# Patient Record
Sex: Female | Born: 1975 | Race: White | Hispanic: No | Marital: Married | Smoking: Never smoker
Health system: Southern US, Community
[De-identification: ages and names within clinical notes are randomized; demographics above are authoritative.]

## PROBLEM LIST (undated history)

## (undated) DIAGNOSIS — Z803 Family history of malignant neoplasm of breast: Secondary | ICD-10-CM

## (undated) HISTORY — DX: Family history of malignant neoplasm of breast: Z80.3

## (undated) HISTORY — PX: LITHOTRIPSY: SUR834

---

## 2012-10-03 ENCOUNTER — Emergency Department (HOSPITAL_COMMUNITY): Payer: No Typology Code available for payment source

## 2012-10-03 ENCOUNTER — Emergency Department (HOSPITAL_COMMUNITY)
Admission: EM | Admit: 2012-10-03 | Discharge: 2012-10-03 | Disposition: A | Discharge status: Home / Self Care | Payer: No Typology Code available for payment source | Attending: Emergency Medicine | Admitting: Emergency Medicine

## 2012-10-03 ENCOUNTER — Encounter (HOSPITAL_COMMUNITY): Payer: Self-pay

## 2012-10-03 DIAGNOSIS — R197 Diarrhea, unspecified: Secondary | ICD-10-CM | POA: Insufficient documentation

## 2012-10-03 DIAGNOSIS — F419 Anxiety disorder, unspecified: Secondary | ICD-10-CM

## 2012-10-03 DIAGNOSIS — R42 Dizziness and giddiness: Secondary | ICD-10-CM | POA: Insufficient documentation

## 2012-10-03 DIAGNOSIS — Z79899 Other long term (current) drug therapy: Secondary | ICD-10-CM | POA: Insufficient documentation

## 2012-10-03 DIAGNOSIS — F411 Generalized anxiety disorder: Secondary | ICD-10-CM | POA: Insufficient documentation

## 2012-10-03 LAB — URINALYSIS, ROUTINE W REFLEX MICROSCOPIC
Nitrite: NEGATIVE
Specific Gravity, Urine: 1.004 — ABNORMAL LOW (ref 1.005–1.030)
Urobilinogen, UA: 0.2 mg/dL (ref 0.0–1.0)
pH: 6 (ref 5.0–8.0)

## 2012-10-03 LAB — CBC WITH DIFFERENTIAL/PLATELET
Basophils Absolute: 0 10*3/uL (ref 0.0–0.1)
Basophils Relative: 0 % (ref 0–1)
Eosinophils Absolute: 0 10*3/uL (ref 0.0–0.7)
Eosinophils Relative: 0 % (ref 0–5)
HCT: 42.5 % (ref 36.0–46.0)
MCHC: 36.5 g/dL — ABNORMAL HIGH (ref 30.0–36.0)
MCV: 87.1 fL (ref 78.0–100.0)
Monocytes Absolute: 0.5 10*3/uL (ref 0.1–1.0)
Platelets: 170 10*3/uL (ref 150–400)
RDW: 12.3 % (ref 11.5–15.5)

## 2012-10-03 LAB — BASIC METABOLIC PANEL
CO2: 27 mEq/L (ref 19–32)
Calcium: 10.2 mg/dL (ref 8.4–10.5)
Creatinine, Ser: 0.64 mg/dL (ref 0.50–1.10)
GFR calc Af Amer: >90 mL/min (ref >90)
GFR calc non Af Amer: >90 mL/min (ref >90)

## 2012-10-03 LAB — TSH: TSH: 0.35 u[IU]/mL (ref 0.350–4.500)

## 2012-10-03 LAB — URINE MICROSCOPIC-ADD ON

## 2012-10-03 LAB — POCT PREGNANCY, URINE: Preg Test, Ur: NEGATIVE

## 2012-10-03 NOTE — ED Notes (Signed)
Pt denies stress/anxiety.  States she is under normal stress (work, children, husband) but denies anxiety or stress in the preceding areas.  States PCP felt like it was stress/anxiety and prescribed Paxil but states it has not helped/still has episodes.

## 2012-10-03 NOTE — ED Provider Notes (Signed)
History     CSN: [id number]  Arrival date & time 10/03/12  1150   First MD Initiated Contact with Patient 10/03/12 1610      Chief Complaint  Patient presents with  . Dizziness  . Diarrhea    HPI  36 year old female presents with year-long history of episodes of cold sensation that progressively moves down her body, shortness of breath, chest discomfort, and dizziness. These episodes are brought on at random sporadic times. She states she has a tendency to worry about things. And was recently diagnosed with anxiety. She's was started on Paxil, and Xanax. She was also seen by her primary care provider who told her that she had an abnormality in her thyroid. She has some pending thyroid labs that she plans to followup with her PCP. Today she had another episode similar to the one she's had no past. She states she took her Paxil and her Xanax which seemed to help a bit but she still had the symptoms. She called her primary care provider who instructed her to come to the emergency department. Currently she denies any chest pain, shortness of breath, numbness or tingling, headache, abdominal pain, nausea and or vomiting. She is currently symptom and pain-free. She denies illicit drug use.  No past medical history on file.  No past surgical history on file.  No family history on file.  History  Substance Use Topics  . Smoking status: Never Smoker   . Smokeless tobacco: Not on file  . Alcohol Use: Yes     social    OB History    Grav Para Term Preterm Abortions TAB SAB Ect Mult Living                  Review of Systems  Constitutional: Negative for fever, chills, activity change and appetite change.  HENT: Negative for ear pain, congestion, rhinorrhea and neck pain.   Eyes: Negative for pain.  Respiratory: Negative for cough and shortness of breath.   Cardiovascular: Negative for chest pain and palpitations.  Gastrointestinal: Negative for nausea, vomiting and abdominal pain.    Genitourinary: Negative for dysuria, difficulty urinating and pelvic pain.  Musculoskeletal: Negative for back pain.  Skin: Negative for rash and wound.  Neurological: Negative for weakness and headaches.  Psychiatric/Behavioral: Negative for behavioral problems, confusion and agitation. The patient is nervous/anxious.     Allergies  Iodine and Shellfish allergy  Home Medications   Current Outpatient Rx  Name Route Sig Dispense Refill  . ALPRAZOLAM 0.25 MG PO TABS Oral Take 0.25 mg by mouth daily as needed. For anxiety    . FEXOFENADINE HCL 180 MG PO TABS Oral Take 180 mg by mouth daily.    . IBUPROFEN 200 MG PO TABS Oral Take 600 mg by mouth every 6 (six) hours as needed. For pain    . NAPROXEN SODIUM 220 MG PO TABS Oral Take 440 mg by mouth daily as needed. For pain    . PAROXETINE HCL 10 MG PO TABS Oral Take 10 mg by mouth at bedtime.    Marland Kitchen PRESCRIPTION MEDICATION Injection Inject 1 each as directed once a week. Pt gets 2 vials of allergy shots (medication that she is allergic to, to help build up her immune system).   It is compounded by [facility] [phone]).  Called the facility to confirm      BP 138/93  Pulse 81  Temp 98.4 F (36.9 C) (Oral)  Resp 18  SpO2 100%  LMP 09/12/2012  Physical Exam  Constitutional: She is oriented to person, place, and time. She appears well-developed and well-nourished. No distress.  HENT:  Head: Normocephalic and atraumatic.  Nose: Nose normal.  Mouth/Throat: Oropharynx is clear and moist.  Eyes: EOM are normal. Pupils are equal, round, and reactive to light.  Neck: Normal range of motion. Neck supple. No tracheal deviation present.  Cardiovascular: Normal rate, regular rhythm, normal heart sounds and intact distal pulses.   Pulmonary/Chest: Effort normal and breath sounds normal. She has no rales.  Abdominal: Soft. Bowel sounds are normal. She exhibits no distension. There is no tenderness. There is no rebound and no  guarding.  Musculoskeletal: Normal range of motion. She exhibits no tenderness.  Neurological: She is alert and oriented to person, place, and time.  Skin: Skin is warm and dry. No rash noted.  Psychiatric: She has a normal mood and affect. Her behavior is normal.    ED Course  Procedures (including critical care time)  Results for orders placed during the hospital encounter of 10/03/12  URINALYSIS, ROUTINE W REFLEX MICROSCOPIC      Component Value Range   Color, Urine YELLOW  YELLOW   APPearance CLEAR  CLEAR   Specific Gravity, Urine 1.004 (*) 1.005 - 1.030   pH 6.0  5.0 - 8.0   Glucose, UA NEGATIVE  NEGATIVE mg/dL   Hgb urine dipstick TRACE (*) NEGATIVE   Bilirubin Urine NEGATIVE  NEGATIVE   Ketones, ur NEGATIVE  NEGATIVE mg/dL   Protein, ur NEGATIVE  NEGATIVE mg/dL   Urobilinogen, UA 0.2  0.0 - 1.0 mg/dL   Nitrite NEGATIVE  NEGATIVE   Leukocytes, UA NEGATIVE  NEGATIVE  POCT PREGNANCY, URINE      Component Value Range   Preg Test, Ur NEGATIVE  NEGATIVE  URINE MICROSCOPIC-ADD ON      Component Value Range   Squamous Epithelial / LPF RARE  RARE   WBC, UA 0-2  <3 WBC/hpf   RBC / HPF 0-2  <3 RBC/hpf   Bacteria, UA RARE  RARE  CBC WITH DIFFERENTIAL      Component Value Range   WBC 9.5  4.0 - 10.5 K/uL   RBC 4.88  3.87 - 5.11 MIL/uL   Hemoglobin 15.5 (*) 12.0 - 15.0 g/dL   HCT 04.5  40.9 - 81.1 %   MCV 87.1  78.0 - 100.0 fL   MCH 31.8  26.0 - 34.0 pg   MCHC 36.5 (*) 30.0 - 36.0 g/dL   RDW 91.4  78.2 - 95.6 %   Platelets 170  150 - 400 K/uL   Neutrophils Relative 67  43 - 77 %   Neutro Abs 6.4  1.7 - 7.7 K/uL   Lymphocytes Relative 27  12 - 46 %   Lymphs Abs 2.5  0.7 - 4.0 K/uL   Monocytes Relative 6  3 - 12 %   Monocytes Absolute 0.5  0.1 - 1.0 K/uL   Eosinophils Relative 0  0 - 5 %   Eosinophils Absolute 0.0  0.0 - 0.7 K/uL   Basophils Relative 0  0 - 1 %   Basophils Absolute 0.0  0.0 - 0.1 K/uL  BASIC METABOLIC PANEL      Component Value Range   Sodium 137  135  - 145 mEq/L   Potassium 4.0  3.5 - 5.1 mEq/L   Chloride 101  96 - 112 mEq/L   CO2 27  19 - 32 mEq/L   Glucose, Bld 121 (*)  70 - 99 mg/dL   BUN 7  6 - 23 mg/dL   Creatinine, Ser 1.61  0.50 - 1.10 mg/dL   Calcium 09.6  8.4 - 04.5 mg/dL   GFR calc non Af Amer >90  >90 mL/min   GFR calc Af Amer >90  >90 mL/min  TSH      Component Value Range   TSH 0.350  0.350 - 4.500 uIU/mL    DG Chest 2 View (Final result)   Result time:10/03/12 1702    Final result by Rad Results In Interface (10/03/12 17:02:32)    Narrative:   *RADIOLOGY REPORT*  Clinical Data: Nausea. Spells.  CHEST - 2 VIEW  Comparison: None.  Findings: Lungs clear. Heart size is normal. No pneumothorax pleural fluid. No focal bony abnormality.  IMPRESSION: Negative chest.   Original Report Authenticated By: Bernadene Bell. D'ALESSIO, M.D.          1. Anxiety       MDM    36 year old female in no acute distress, afebrile, vital signs stable, non toxic appearing who presents with chronic history of brief episodes shortness of breath, tachycardia, cold extremities, and chest pain. During ED course the patient had no more recurrent episodes. She states that she is followed by her primary care doctor and has a thyroid panel that is currently pending. Lab work reassuring. Chest x-ray normal. TSH normal. Thorough discussion with patient on findings, return precautions, and plan. Will follow up with her primary care provider to obtain results of thyroid panel. At time of discharge patient hemodynamically stable, tolerating by mouth, ambulating well.        Nadara Mustard, MD 10/04/12 [phone]

## 2012-10-03 NOTE — ED Notes (Signed)
Pt recently started treatment for anxiety, sts she has waves of energy come over, dizzy, sob, and becomes pale. Labs showed abn thyroid studies and she just does not feel right.

## 2012-10-04 NOTE — ED Provider Notes (Deleted)
CRITICAL CARE Performed by: Nelia Shi   Total critical care time:45 min  Critical care time was exclusive of separately billable procedures and treating other patients.  Critical care was necessary to treat or prevent imminent or life-threatening deterioration.  Critical care was time spent personally by me on the following activities: development of treatment plan with patient and/or surrogate as well as nursing, discussions with consultants, evaluation of patient's response to treatment, examination of patient, obtaining history from patient or surrogate, ordering and performing treatments and interventions, ordering and review of laboratory studies, ordering and review of radiographic studies, pulse oximetry and re-evaluation of patient's condition.  I saw and evaluated the patient, reviewed the resident's note and I agree with the findings and plan.   .Face to face Exam:  General:  Awake HEENT:  Atraumatic Resp:  Normal effort Abd:  Nondistended Neuro:No focal weakness Lymph: No adenopathy    Nelia Shi, MD 10/04/12 1202

## 2012-10-04 NOTE — ED Provider Notes (Signed)
I saw and evaluated the patient, reviewed the resident's note and I agree with the findings and plan.     Nelia Shi, MD 10/04/12 [phone]

## 2014-04-14 LAB — HM PAP SMEAR: HM PAP: NEGATIVE

## 2014-04-24 DIAGNOSIS — G479 Sleep disorder, unspecified: Secondary | ICD-10-CM | POA: Insufficient documentation

## 2016-03-14 ENCOUNTER — Encounter: Payer: Self-pay | Admitting: Emergency Medicine

## 2016-03-14 ENCOUNTER — Emergency Department
Admission: EM | Admit: 2016-03-14 | Discharge: 2016-03-14 | Disposition: A | Discharge status: Home / Self Care | Payer: Worker's Compensation | Attending: Emergency Medicine | Admitting: Emergency Medicine

## 2016-03-14 DIAGNOSIS — Y9301 Activity, walking, marching and hiking: Secondary | ICD-10-CM | POA: Diagnosis not present

## 2016-03-14 DIAGNOSIS — S59911A Unspecified injury of right forearm, initial encounter: Secondary | ICD-10-CM | POA: Insufficient documentation

## 2016-03-14 DIAGNOSIS — S4990XA Unspecified injury of shoulder and upper arm, unspecified arm, initial encounter: Secondary | ICD-10-CM | POA: Diagnosis not present

## 2016-03-14 DIAGNOSIS — Y998 Other external cause status: Secondary | ICD-10-CM | POA: Diagnosis not present

## 2016-03-14 DIAGNOSIS — M5412 Radiculopathy, cervical region: Secondary | ICD-10-CM | POA: Diagnosis not present

## 2016-03-14 DIAGNOSIS — R110 Nausea: Secondary | ICD-10-CM | POA: Insufficient documentation

## 2016-03-14 DIAGNOSIS — S0990XA Unspecified injury of head, initial encounter: Secondary | ICD-10-CM | POA: Diagnosis not present

## 2016-03-14 DIAGNOSIS — Z79899 Other long term (current) drug therapy: Secondary | ICD-10-CM | POA: Insufficient documentation

## 2016-03-14 DIAGNOSIS — W208XXA Other cause of strike by thrown, projected or falling object, initial encounter: Secondary | ICD-10-CM | POA: Diagnosis not present

## 2016-03-14 DIAGNOSIS — S59912A Unspecified injury of left forearm, initial encounter: Secondary | ICD-10-CM | POA: Diagnosis not present

## 2016-03-14 DIAGNOSIS — Y9289 Other specified places as the place of occurrence of the external cause: Secondary | ICD-10-CM | POA: Diagnosis not present

## 2016-03-14 DIAGNOSIS — S161XXA Strain of muscle, fascia and tendon at neck level, initial encounter: Secondary | ICD-10-CM

## 2016-03-14 MED ORDER — CYCLOBENZAPRINE HCL 10 MG PO TABS: 10.0000 mg | ORAL_TABLET | Freq: Three times a day (TID) | ORAL | Status: DC | PRN

## 2016-03-14 MED ORDER — HYDROCODONE-ACETAMINOPHEN 5-325 MG PO TABS: 1.0000 | ORAL_TABLET | ORAL | Status: DC | PRN

## 2016-03-14 MED ORDER — ONDANSETRON HCL 4 MG PO TABS: 4.0000 mg | ORAL_TABLET | Freq: Three times a day (TID) | ORAL | Status: DC | PRN

## 2016-03-14 NOTE — ED Notes (Addendum)
Pt states was at work and a 20 pound box fell on top of her head. Pt reports now has neck pain, headache that radiates into her shoulders and arm. Pt states her fingers have been numb.Pt states became nauseated while driving here but denies vomiting. Pt will be filing worker's compensation claim.

## 2016-03-14 NOTE — Discharge Instructions (Signed)
Concussion, Adult  A concussion, or closed-head injury, is a brain injury caused by a direct blow to the head or by a quick and sudden movement (jolt) of the head or neck. Concussions are usually not life-threatening. Even so, the effects of a concussion can be serious. If you have had a concussion before, you are more likely to experience concussion-like symptoms after a direct blow to the head.   CAUSES  · Direct blow to the head, such as from running into another player during a soccer game, being hit in a fight, or hitting your head on a hard surface.  · A jolt of the head or neck that causes the brain to move back and forth inside the skull, such as in a car crash.  SIGNS AND SYMPTOMS  The signs of a concussion can be hard to notice. Early on, they may be missed by you, family members, and health care providers. You may look fine but act or feel differently.  Symptoms are usually temporary, but they may last for days, weeks, or even longer. Some symptoms may appear right away while others may not show up for hours or days. Every head injury is different. Symptoms include:  · Mild to moderate headaches that will not go away.  · A feeling of pressure inside your head.  · Having more trouble than usual:    Learning or remembering things you have heard.    Answering questions.    Paying attention or concentrating.    Organizing daily tasks.    Making decisions and solving problems.  · Slowness in thinking, acting or reacting, speaking, or reading.  · Getting lost or being easily confused.  · Feeling tired all the time or lacking energy (fatigued).  · Feeling drowsy.  · Sleep disturbances.    Sleeping more than usual.    Sleeping less than usual.    Trouble falling asleep.    Trouble sleeping (insomnia).  · Loss of balance or feeling lightheaded or dizzy.  · Nausea or vomiting.  · Numbness or tingling.  · Increased sensitivity to:    Sounds.    Lights.    Distractions.  · Vision problems or eyes that tire  easily.  · Diminished sense of taste or smell.  · Ringing in the ears.  · Mood changes such as feeling sad or anxious.  · Becoming easily irritated or angry for little or no reason.  · Lack of motivation.  · Seeing or hearing things other people do not see or hear (hallucinations).  DIAGNOSIS  Your health care provider can usually diagnose a concussion based on a description of your injury and symptoms. He or she will ask whether you passed out (lost consciousness) and whether you are having trouble remembering events that happened right before and during your injury.  Your evaluation might include:  · A brain scan to look for signs of injury to the brain. Even if the test shows no injury, you may still have a concussion.  · Blood tests to be sure other problems are not present.  TREATMENT  · Concussions are usually treated in an emergency department, in urgent care, or at a clinic. You may need to stay in the hospital overnight for further treatment.  · Tell your health care provider if you are taking any medicines, including prescription medicines, over-the-counter medicines, and natural remedies. Some medicines, such as blood thinners (anticoagulants) and aspirin, may increase the chance of complications. Also tell your health care   provider whether you have had alcohol or are taking illegal drugs. This information may affect treatment.  · Your health care provider will send you home with important instructions to follow.  · How fast you will recover from a concussion depends on many factors. These factors include how severe your concussion is, what part of your brain was injured, your age, and how healthy you were before the concussion.  · Most people with mild injuries recover fully. Recovery can take time. In general, recovery is slower in older persons. Also, persons who have had a concussion in the past or have other medical problems may find that it takes longer to recover from their current injury.  HOME  CARE INSTRUCTIONS  General Instructions  · Carefully follow the directions your health care provider gave you.  · Only take over-the-counter or prescription medicines for pain, discomfort, or fever as directed by your health care provider.  · Take only those medicines that your health care provider has approved.  · Do not drink alcohol until your health care provider says you are well enough to do so. Alcohol and certain other drugs may slow your recovery and can put you at risk of further injury.  · If it is harder than usual to remember things, write them down.  · If you are easily distracted, try to do one thing at a time. For example, do not try to watch TV while fixing dinner.  · Talk with family members or close friends when making important decisions.  · Keep all follow-up appointments. Repeated evaluation of your symptoms is recommended for your recovery.  · Watch your symptoms and tell others to do the same. Complications sometimes occur after a concussion. Older adults with a brain injury may have a higher risk of serious complications, such as a blood clot on the brain.  · Tell your teachers, school nurse, school counselor, coach, athletic trainer, or work manager about your injury, symptoms, and restrictions. Tell them about what you can or cannot do. They should watch for:    Increased problems with attention or concentration.    Increased difficulty remembering or learning new information.    Increased time needed to complete tasks or assignments.    Increased irritability or decreased ability to cope with stress.    Increased symptoms.  · Rest. Rest helps the brain to heal. Make sure you:    Get plenty of sleep at night. Avoid staying up late at night.    Keep the same bedtime hours on weekends and weekdays.    Rest during the day. Take daytime naps or rest breaks when you feel tired.  · Limit activities that require a lot of thought or concentration. These include:    Doing homework or job-related  work.    Watching TV.    Working on the computer.  · Avoid any situation where there is potential for another head injury (football, hockey, soccer, basketball, martial arts, downhill snow sports and horseback riding). Your condition will get worse every time you experience a concussion. You should avoid these activities until you are evaluated by the appropriate follow-up health care providers.  Returning To Your Regular Activities  You will need to return to your normal activities slowly, not all at once. You must give your body and brain enough time for recovery.  · Do not return to sports or other athletic activities until your health care provider tells you it is safe to do so.  · Ask   your health care provider when you can drive, ride a bicycle, or operate heavy machinery. Your ability to react may be slower after a brain injury. Never do these activities if you are dizzy.  · Ask your health care provider about when you can return to work or school.  Preventing Another Concussion  It is very important to avoid another brain injury, especially before you have recovered. In rare cases, another injury can lead to permanent brain damage, brain swelling, or death. The risk of this is greatest during the first 7-10 days after a head injury. Avoid injuries by:  · Wearing a seat belt when riding in a car.  · Drinking alcohol only in moderation.  · Wearing a helmet when biking, skiing, skateboarding, skating, or doing similar activities.  · Avoiding activities that could lead to a second concussion, such as contact or recreational sports, until your health care provider says it is okay.  · Taking safety measures in your home.    Remove clutter and tripping hazards from floors and stairways.    Use grab bars in bathrooms and handrails by stairs.    Place non-slip mats on floors and in bathtubs.    Improve lighting in dim areas.  SEEK MEDICAL CARE IF:  · You have increased problems paying attention or  concentrating.  · You have increased difficulty remembering or learning new information.  · You need more time to complete tasks or assignments than before.  · You have increased irritability or decreased ability to cope with stress.  · You have more symptoms than before.  Seek medical care if you have any of the following symptoms for more than 2 weeks after your injury:  · Lasting (chronic) headaches.  · Dizziness or balance problems.  · Nausea.  · Vision problems.  · Increased sensitivity to noise or light.  · Depression or mood swings.  · Anxiety or irritability.  · Memory problems.  · Difficulty concentrating or paying attention.  · Sleep problems.  · Feeling tired all the time.  SEEK IMMEDIATE MEDICAL CARE IF:  · You have severe or worsening headaches. These may be a sign of a blood clot in the brain.  · You have weakness (even if only in one hand, leg, or part of the face).  · You have numbness.  · You have decreased coordination.  · You vomit repeatedly.  · You have increased sleepiness.  · One pupil is larger than the other.  · You have convulsions.  · You have slurred speech.  · You have increased confusion. This may be a sign of a blood clot in the brain.  · You have increased restlessness, agitation, or irritability.  · You are unable to recognize people or places.  · You have neck pain.  · It is difficult to wake you up.  · You have unusual behavior changes.  · You lose consciousness.  MAKE SURE YOU:  · Understand these instructions.  · Will watch your condition.  · Will get help right away if you are not doing well or get worse.     This information is not intended to replace advice given to you by your health care provider. Make sure you discuss any questions you have with your health care provider.     Document Released: 02/25/2004 Document Revised: 12/26/2014 Document Reviewed: 06/27/2013  [employer] Interactive Patient Education ©2016 [employer]

## 2016-03-14 NOTE — ED Provider Notes (Signed)
[facility] Emergency Department Provider Note ____________________________________________  Time seen: Approximately 7:38 PM  I have reviewed the triage vital signs and the nursing notes.   HISTORY  Chief Complaint Head Injury    HPI Dawn Wall is a 40 y.o. female who presents to the emergency department for evaluation after head injury. She states that while at work, she was walking by a shelf that had a box weighing approximately 20 pounds fall onto the top of her head. She denies loss of consciousness, but states that she has felt a little dizzy and nauseated but is not confused and the dizziness has resolved. She states that she has had some intermittent blurred vision as well.She also complains of some bilateral arm sensations that reassemble how she would feel after lifting weights, but denies recent exercise. She reports she has a history of degenerative disc disease without surgery.  History reviewed. No pertinent past medical history.  There are no active problems to display for this patient.   Past Surgical History  Procedure Laterality Date  . Lithotripsy      Current Outpatient Rx  Name  Route  Sig  Dispense  Refill  . ALPRAZolam (XANAX) 0.25 MG tablet   Oral   Take 0.25 mg by mouth daily as needed. For anxiety         . cyclobenzaprine (FLEXERIL) 10 MG tablet   Oral   Take 1 tablet (10 mg total) by mouth 3 (three) times daily as needed for muscle spasms.   30 tablet   0   . fexofenadine (ALLEGRA) 180 MG tablet   Oral   Take 180 mg by mouth daily.         Marland Kitchen HYDROcodone-acetaminophen (NORCO/VICODIN) 5-325 MG tablet   Oral   Take 1 tablet by mouth every 4 (four) hours as needed for moderate pain.   20 tablet   0   . ibuprofen (ADVIL,MOTRIN) 200 MG tablet   Oral   Take 600 mg by mouth every 6 (six) hours as needed. For pain         . naproxen sodium (ANAPROX) 220 MG tablet   Oral   Take 440 mg by mouth daily as needed.  For pain         . ondansetron (ZOFRAN) 4 MG tablet   Oral   Take 1 tablet (4 mg total) by mouth every 8 (eight) hours as needed for nausea or vomiting.   20 tablet   0   . PARoxetine (PAXIL) 10 MG tablet   Oral   Take 10 mg by mouth at bedtime.         Marland Kitchen PRESCRIPTION MEDICATION   Injection   Inject 1 each as directed once a week. Pt gets 2 vials of allergy shots (medication that she is allergic to, to help build up her immune system).   It is compounded by [facility] [phone]).  Called the facility to confirm           Allergies Iodine and Shellfish allergy  No family history on file.  Social History Social History  Substance Use Topics  . Smoking status: Never Smoker   . Smokeless tobacco: None  . Alcohol Use: Yes     Comment: social    Review of Systems Constitutional: No recent illness. Eyes: Intermittent blurred vision ENT: No sore throat. Cardiovascular: Denies chest pain or palpitations. Respiratory: Denies shortness of breath. Gastrointestinal: Positive for nausea Musculoskeletal: Pain in back of head,  neck, shoulders, and bilateral forearms. Skin: Negative for visual laceration. Neurological: Negative for headaches, focal weakness or numbness. Positive for abnormal sensation of the bilateral forearms. ____________________________________________   PHYSICAL EXAM:  VITAL SIGNS: ED Triage Vitals  Enc Vitals Group     BP 03/14/16 1804 144/91 mmHg     Pulse Rate 03/14/16 1804 97     Resp 03/14/16 1804 18     Temp 03/14/16 1804 98.2 F (36.8 C)     Temp Source 03/14/16 1804 Oral     SpO2 03/14/16 1804 97 %     Weight 03/14/16 1804 135 lb (61.236 kg)     Height 03/14/16 1804 5\' 2"  (1.575 m)     Head Cir --      Peak Flow --      Pain Score 03/14/16 1804 7     Pain Loc --      Pain Edu? --      Excl. in GC? --     Constitutional: Alert and oriented. Well appearing and in no acute distress. Eyes: Conjunctivae are normal.  EOMI. Head: Atraumatic. Nose: No congestion/rhinnorhea. Neck: No stridor.  Respiratory: Normal respiratory effort.   Musculoskeletal: Active, full range of motion of the neck. Nexus criteria negative. Cervical paraspinal tenderness present on exam. Trapezius tenderness on exam. Neurologic:  Normal speech and language. No gross focal neurologic deficits are appreciated. Speech is normal. No gait instability. Cranial nerves normal as tested. Grip strength is equal. 2 point discrimination intact of bilateral forearms and hands. Skin:  Skin is warm, dry and intact. Atraumatic. Psychiatric: Mood and affect are normal. Speech and behavior are normal.  ____________________________________________   LABS (all labs ordered are listed, but only abnormal results are displayed)  Labs Reviewed - No data to display ____________________________________________  RADIOLOGY  CT not indicated ____________________________________________   PROCEDURES  Procedure(s) performed: None   ____________________________________________   INITIAL IMPRESSION / ASSESSMENT AND PLAN / ED COURSE  Pertinent labs & imaging results that were available during my care of the patient were reviewed by me and considered in my medical decision making (see chart for details).  Patient was advised to return to the emergency department immediately for any changes of concern. She was provided head injury instructions as well as cervical spine strain and cervical radiculopathy instructions. She intends to follow up with her primary care or walking clinic [facility] for symptoms that are not improving over the next few days. He was also advised to follow-up with her neurologist for paresthesias if they do not resolve with medications. ____________________________________________   FINAL CLINICAL IMPRESSION(S) / ED DIAGNOSES  Final diagnoses:  Minor head injury, initial encounter  Cervical strain, acute, initial  encounter  Cervical radiculopathy       Chinita PesterCari B Karleen Seebeck, FNP 03/14/16 2016  Loleta Roseory Forbach, MD 03/14/16 2351

## 2016-03-14 NOTE — ED Notes (Signed)
See triage note  States she had a box fall on her head at work. Denies any LOC but is having some pain to neck and shoulders

## 2016-03-17 ENCOUNTER — Encounter: Payer: Self-pay | Admitting: Emergency Medicine

## 2016-03-17 ENCOUNTER — Emergency Department
Admission: EM | Admit: 2016-03-17 | Discharge: 2016-03-17 | Disposition: A | Discharge status: Home / Self Care | Payer: Worker's Compensation | Attending: Emergency Medicine | Admitting: Emergency Medicine

## 2016-03-17 ENCOUNTER — Emergency Department: Payer: Worker's Compensation

## 2016-03-17 DIAGNOSIS — Z79899 Other long term (current) drug therapy: Secondary | ICD-10-CM | POA: Insufficient documentation

## 2016-03-17 DIAGNOSIS — M542 Cervicalgia: Secondary | ICD-10-CM | POA: Diagnosis present

## 2016-03-17 DIAGNOSIS — S161XXD Strain of muscle, fascia and tendon at neck level, subsequent encounter: Secondary | ICD-10-CM | POA: Diagnosis not present

## 2016-03-17 DIAGNOSIS — F0781 Postconcussional syndrome: Secondary | ICD-10-CM | POA: Diagnosis not present

## 2016-03-17 DIAGNOSIS — Z791 Long term (current) use of non-steroidal anti-inflammatories (NSAID): Secondary | ICD-10-CM | POA: Diagnosis not present

## 2016-03-17 DIAGNOSIS — W208XXD Other cause of strike by thrown, projected or falling object, subsequent encounter: Secondary | ICD-10-CM | POA: Diagnosis not present

## 2016-03-17 DIAGNOSIS — M5412 Radiculopathy, cervical region: Secondary | ICD-10-CM | POA: Insufficient documentation

## 2016-03-17 MED ORDER — METOCLOPRAMIDE HCL 10 MG PO TABS
10.0000 mg | ORAL_TABLET | Freq: Once | ORAL | Status: AC
  Administered 2016-03-17: 10 mg via ORAL
  Filled 2016-03-17: qty 1

## 2016-03-17 MED ORDER — KETOROLAC TROMETHAMINE 60 MG/2ML IM SOLN
30.0000 mg | Freq: Once | INTRAMUSCULAR | Status: AC
  Administered 2016-03-17: 30 mg via INTRAMUSCULAR
  Filled 2016-03-17: qty 2

## 2016-03-17 MED ORDER — DIAZEPAM 2 MG PO TABS: 1.0000 mg | ORAL_TABLET | Freq: Three times a day (TID) | ORAL | Status: DC | PRN

## 2016-03-17 MED ORDER — DIAZEPAM 5 MG PO TABS
5.0000 mg | ORAL_TABLET | Freq: Once | ORAL | Status: AC
  Administered 2016-03-17: 5 mg via ORAL
  Filled 2016-03-17: qty 1

## 2016-03-17 MED ORDER — KETOROLAC TROMETHAMINE 10 MG PO TABS: 10.0000 mg | ORAL_TABLET | Freq: Three times a day (TID) | ORAL | Status: DC

## 2016-03-17 MED ORDER — METOCLOPRAMIDE HCL 5 MG PO TABS: 5.0000 mg | ORAL_TABLET | Freq: Three times a day (TID) | ORAL | Status: DC | PRN

## 2016-03-17 MED ORDER — DIPHENHYDRAMINE HCL 25 MG PO CAPS
25.0000 mg | ORAL_CAPSULE | Freq: Once | ORAL | Status: AC
  Administered 2016-03-17: 25 mg via ORAL
  Filled 2016-03-17: qty 1

## 2016-03-17 NOTE — Discharge Instructions (Signed)
Cervical Radiculopathy Cervical radiculopathy means that a nerve in the neck is pinched or bruised. This can cause pain or loss of feeling (numbness) that runs from your neck to your arm and fingers. HOME CARE Managing Pain  Take over-the-counter and prescription medicines only as told by your doctor.  If directed, put ice on the injured or painful area.  Put ice in a plastic bag.  Place a towel between your skin and the bag.  Leave the ice on for 20 minutes, 2-3 times per day.  If ice does not help, you can try using heat. Take a warm shower or warm bath, or use a heat pack as told by your doctor.  You may try a gentle neck and shoulder massage. Activity  Rest as needed. Follow instructions from your doctor about any activities to avoid.  Do exercises as told by your doctor or physical therapist. General Instructions   If you were given a soft collar, wear it as told by your doctor.  Use a flat pillow when you sleep.  Keep all follow-up visits as told by your doctor. This is important. GET HELP IF:  Your condition does not improve with treatment. GET HELP RIGHT AWAY IF:   Your pain gets worse and is not controlled with medicine.  You lose feeling or feel weak in your hand, arm, face, or leg.  You have a fever.  You have a stiff neck.  You cannot control when you poop or pee (have incontinence).  You have trouble with walking, balance, or talking.   This information is not intended to replace advice given to you by your health care provider. Make sure you discuss any questions you have with your health care provider.   Document Released: 11/24/2011 Document Revised: 08/26/2015 Document Reviewed: 01/29/2015 Elsevier Interactive Patient Education 2016 [employer]  Post-Concussion Syndrome Post-concussion syndrome describes the symptoms that can occur after a head injury. These symptoms can last from weeks to months. CAUSES  It is not clear why some head  injuries cause post-concussion syndrome. It can occur whether your head injury was mild or severe and whether you were wearing head protection or not.  SIGNS AND SYMPTOMS  Memory difficulties.  Dizziness.  Headaches.  Double vision or blurry vision.  Sensitivity to light.  Hearing difficulties.  Depression.  Tiredness.  Weakness.  Difficulty with concentration.  Difficulty sleeping or staying asleep.  Vomiting.  Poor balance or instability on your feet.  Slow reaction time.  Difficulty learning and remembering things you have heard. DIAGNOSIS  There is no test to determine whether you have post-concussion syndrome. Your health care provider may order an imaging scan of your brain, such as a CT scan, to check for other problems that may be causing your symptoms (such as a severe injury inside your skull). TREATMENT  Usually, these problems disappear over time without medical care. Your health care provider may prescribe medicine to help ease your symptoms. It is important to follow up with a neurologist to evaluate your recovery and address any lingering symptoms or issues. HOME CARE INSTRUCTIONS   Take medicines only as directed by your health care provider. Do not take aspirin. Aspirin can slow blood clotting.  Sleep with your head slightly elevated to help with headaches.  Avoid any situation where there is potential for another head injury. This includes football, hockey, soccer, basketball, martial arts, downhill snow sports, and horseback riding. Your condition will get worse every time you experience a concussion. You  should avoid these activities until you are evaluated by the appropriate follow-up health care providers.  Keep all follow-up visits as directed by your health care provider. This is important. SEEK MEDICAL CARE IF:  You have increased problems paying attention or concentrating.  You have increased difficulty remembering or learning new  information.  You need more time to complete tasks or assignments than before.  You have increased irritability or decreased ability to cope with stress.  You have more symptoms than before. Seek medical care if you have any of the following symptoms for more than two weeks after your injury:  Lasting (chronic) headaches.  Dizziness or balance problems.  Nausea.  Vision problems.  Increased sensitivity to noise or light.  Depression or mood swings.  Anxiety or irritability.  Memory problems.  Difficulty concentrating or paying attention.  Sleep problems.  Feeling tired all the time. SEEK IMMEDIATE MEDICAL CARE IF:  You have confusion or unusual drowsiness.  Others find it difficult to wake you up.  You have nausea or persistent, forceful vomiting.  You feel like you are moving when you are not (vertigo). Your eyes may move rapidly back and forth.  You have convulsions or faint.  You have severe, persistent headaches that are not relieved by medicine.  You cannot use your arms or legs normally.  One of your pupils is larger than the other.  You have clear or bloody discharge from your nose or ears.  Your problems are getting worse, not better. MAKE SURE YOU:  Understand these instructions.  Will watch your condition.  Will get help right away if you are not doing well or get worse.   This information is not intended to replace advice given to you by your health care provider. Make sure you discuss any questions you have with your health care provider.   Document Released: 05/27/2002 Document Revised: 12/26/2014 Document Reviewed: 03/12/2014 Elsevier Interactive Patient Education [employer]   Your exam and neck x-ray are essentially normal today following your evaluation. You should continue to monitor and treat symptoms as discussed. Follow-up with your company's provider or return to the ED for worsening symptoms as described. Take the  prescription meds with precaution due to drowsiness.

## 2016-03-17 NOTE — ED Provider Notes (Signed)
[facility] Emergency Department Provider Note ____________________________________________  Time seen: 1610  I have reviewed the triage vital signs and the nursing notes.  HISTORY  Chief Complaint  Headache; Torticollis; and Shoulder Pain  HPI Dawn Wall is a 40 y.o. female presents to the ED from [facility], for evaluation of continued headache pain, neck pain, and upper extremity paresthesias. The patient was evaluated here on Monday, following a work-related injury. She describes a 20 pound box of supplies falling from an overhead shelf hitting her on top of the head. She describes an accordion-type mechanismcausing neck pain and posterior headache pain. She also describes some numbness into the upper extremities including the palms and fingers. She was evaluated here and found to be clinically stable with no indication for head CT at that time. She was discharged with prescriptions for Flexeril and Vicodin to dose as needed for headache and neck pain. She denies significant pain relief using those meds. The patient was also held out of work for one day. She returned to work on Wednesday, but noted increased headache pain, dizziness, lack of concentration, fogginess, and fatigue. She reports a remote history of neck injury and report of cervical fusion following a cheerleading accident several years ago, evaluated by a chiropractor. She is primarily complaining of right temporal headache pain, but is concerned about her right-sided neck pain and paresthesias. She reports her headache pain at 9/10 at arrival, but is 7/10 during interview.   History reviewed. No pertinent past medical history.  There are no active problems to display for this patient.   Past Surgical History  Procedure Laterality Date  . Lithotripsy      Current Outpatient Rx  Name  Route  Sig  Dispense  Refill  . ALPRAZolam (XANAX) 0.25 MG tablet   Oral   Take 0.25 mg by mouth daily as needed. For  anxiety         . cyclobenzaprine (FLEXERIL) 10 MG tablet   Oral   Take 1 tablet (10 mg total) by mouth 3 (three) times daily as needed for muscle spasms.   30 tablet   0   . diazepam (VALIUM) 2 MG tablet   Oral   Take 0.5 tablets (1 mg total) by mouth every 8 (eight) hours as needed for muscle spasms.   10 tablet   0   . fexofenadine (ALLEGRA) 180 MG tablet   Oral   Take 180 mg by mouth daily.         Marland Kitchen HYDROcodone-acetaminophen (NORCO/VICODIN) 5-325 MG tablet   Oral   Take 1 tablet by mouth every 4 (four) hours as needed for moderate pain.   20 tablet   0   . ibuprofen (ADVIL,MOTRIN) 200 MG tablet   Oral   Take 600 mg by mouth every 6 (six) hours as needed. For pain         . ketorolac (TORADOL) 10 MG tablet   Oral   Take 1 tablet (10 mg total) by mouth every 8 (eight) hours.   15 tablet   0   . metoCLOPramide (REGLAN) 5 MG tablet   Oral   Take 1 tablet (5 mg total) by mouth every 8 (eight) hours as needed for nausea or vomiting.   15 tablet   0   . naproxen sodium (ANAPROX) 220 MG tablet   Oral   Take 440 mg by mouth daily as needed. For pain         . ondansetron (ZOFRAN) 4  MG tablet   Oral   Take 1 tablet (4 mg total) by mouth every 8 (eight) hours as needed for nausea or vomiting.   20 tablet   0   . PARoxetine (PAXIL) 10 MG tablet   Oral   Take 10 mg by mouth at bedtime.         Marland Kitchen PRESCRIPTION MEDICATION   Injection   Inject 1 each as directed once a week. Pt gets 2 vials of allergy shots (medication that she is allergic to, to help build up her immune system).   It is compounded by [facility] [phone]).  Called the facility to confirm          Allergies Iodine and Shellfish allergy  No family history on file.  Social History Social History  Substance Use Topics  . Smoking status: Never Smoker   . Smokeless tobacco: None  . Alcohol Use: Yes     Comment: social   Review of Systems  Constitutional:  Negative for fever. Eyes: Negative for visual changes. Cardiovascular: Negative for chest pain. Respiratory: Negative for shortness of breath. Gastrointestinal: Negative for abdominal pain, vomiting and diarrhea. Musculoskeletal: Negative for back pain. Positive for neck pain as above Skin: Negative for rash. Neurological: Positive for headache and UE numbness.  ____________________________________________  PHYSICAL EXAM:  VITAL SIGNS: ED Triage Vitals  Enc Vitals Group     BP 03/17/16 1559 153/95 mmHg     Pulse Rate 03/17/16 1559 116     Resp 03/17/16 1559 18     Temp 03/17/16 1559 97.5 F (36.4 C)     Temp Source 03/17/16 1559 Oral     SpO2 03/17/16 1559 97 %     Weight 03/17/16 1559 135 lb (61.236 kg)     Height 03/17/16 1559  (1.549 m)     Head Cir --      Peak Flow --      Pain Score 03/17/16 1600 9     Pain Loc --      Pain Edu? --      Excl. in GC? --    Constitutional: Alert and oriented. Well appearing and in no distress. Head: Normocephalic and atraumatic.      Eyes: Conjunctivae are normal. PERRL. Normal extraocular movements. Normal fundi bilaterally.      Ears: Canals clear. TMs intact bilaterally.   Nose: No congestion/rhinorrhea.   Mouth/Throat: Mucous membranes are moist.   Neck: Supple. No thyromegaly. Full active cervical ROM without deficit.  Hematological/Lymphatic/Immunological: No cervical lymphadenopathy. Cardiovascular: Normal rate, regular rhythm.  Respiratory: Normal respiratory effort. No wheezes/rales/rhonchi. Gastrointestinal: Soft and nontender. No distention. Musculoskeletal: Normal spinal alignment without midline tenderness, spasm, deformity, or step-off. Normal upper extremity resistance testing on exam. Normal composite fist and grip strength. Nontender with normal range of motion in all extremities.  Neurologic: Cranial nerves II through XII grossly intact. Normal intrinsic and opposition testing. Normal UE and LE DTRs  bilaterally. Patient with normal finger to nose exam negative Romberg, and normal tandem walk. No signs of cerebellar ataxia on exam. Normal gait without ataxia. Normal speech and language. No gross focal neurologic deficits are appreciated. Skin:  Skin is warm, dry and intact. No rash noted. Psychiatric: Mood and affect are normal. Patient exhibits appropriate insight and judgment. ____________________________________________   RADIOLOGY  Cervical Spine  IMPRESSION: No acute abnormality noted.  I, Del Wiseman, Charlesetta Ivory, personally viewed and evaluated these images (plain radiographs) as part of my medical decision making,  as well as reviewing the written report by the radiologist. ____________________________________________  PROCEDURES  Toradol 30 mg IM Valium 5 mg PO Reglan 10 mg PO Benadryl 25 mg PO ____________________________________________  INITIAL IMPRESSION / ASSESSMENT AND PLAN / ED COURSE  Patient with continued postconcussive syndrome following an head contusion and cervical strain on Monday. She is reassured by her negative C-spine films. She is advised she may continue to experience postconcussive syndrome for days to weeks following the injury. She will follow-up with her company's medical provider or return to the ED for acutely worsening symptoms. She will be referred to Dr. Loretha BrasilZeylikman (neuro) for continued symptom management as needed. She will be discharged with prescriptions for Valium, Toradol, and Reglan for symptom management. She reports resolution of her headache to 3/10 at discharge. She will be provided with a work note for her next 2 shifts.  ____________________________________________  FINAL CLINICAL IMPRESSION(S) / ED DIAGNOSES  Final diagnoses:  Post concussion syndrome  Cervical strain, acute, subsequent encounter  Cervical radiculopathy, acute      Lissa HoardJenise V Bacon Ariela Mochizuki, PA-C 03/17/16 1858  Arnaldo NatalPaul F Malinda, MD 03/17/16 2227

## 2016-03-17 NOTE — ED Notes (Signed)
Pt reports she has a headache.   Pt was seen in er 3 days ago and was dx with concussion.  Pt had a box fall onto her head.  No loc no vomiting.  Pt continues to  Have a headache on top of her head.  Also reports nausea.  Pt alert.  Speech clear.

## 2016-03-17 NOTE — ED Notes (Signed)
[facility] Follow up visit.  First seen for injury on Monday, here today for follow up.  Today c/o head, neck and shoulder pain and headache.

## 2017-03-09 ENCOUNTER — Encounter: Payer: Self-pay | Admitting: Advanced Practice Midwife

## 2017-03-09 ENCOUNTER — Ambulatory Visit (INDEPENDENT_AMBULATORY_CARE_PROVIDER_SITE_OTHER): Payer: 59 | Admitting: Advanced Practice Midwife

## 2017-03-09 VITALS — BP 122/82 | HR 99 | Ht 61.0 in | Wt 126.0 lb

## 2017-03-09 DIAGNOSIS — Z124 Encounter for screening for malignant neoplasm of cervix: Secondary | ICD-10-CM

## 2017-03-09 DIAGNOSIS — R5383 Other fatigue: Secondary | ICD-10-CM

## 2017-03-09 DIAGNOSIS — Z308 Encounter for other contraceptive management: Secondary | ICD-10-CM

## 2017-03-09 DIAGNOSIS — L659 Nonscarring hair loss, unspecified: Secondary | ICD-10-CM

## 2017-03-09 DIAGNOSIS — Z01419 Encounter for gynecological examination (general) (routine) without abnormal findings: Secondary | ICD-10-CM

## 2017-03-09 NOTE — Patient Instructions (Signed)
Contraception Choices Contraception (birth control) is the use of any methods or devices to prevent pregnancy. Below are some methods to help avoid pregnancy. Hormonal methods  Contraceptive implant. This is a thin, plastic tube containing progesterone hormone. It does not contain estrogen hormone. Your health care provider inserts the tube in the inner part of the upper arm. The tube can remain in place for up to 3 years. After 3 years, the implant must be removed. The implant prevents the ovaries from releasing an egg (ovulation), thickens the cervical mucus to prevent sperm from entering the uterus, and thins the lining of the inside of the uterus.  Progesterone-only injections. These injections are given every 3 months by your health care provider to prevent pregnancy. This synthetic progesterone hormone stops the ovaries from releasing eggs. It also thickens cervical mucus and changes the uterine lining. This makes it harder for sperm to survive in the uterus.  Birth control pills. These pills contain estrogen and progesterone hormone. They work by preventing the ovaries from releasing eggs (ovulation). They also cause the cervical mucus to thicken, preventing the sperm from entering the uterus. Birth control pills are prescribed by a health care provider.Birth control pills can also be used to treat heavy periods.  Minipill. This type of birth control pill contains only the progesterone hormone. They are taken every day of each month and must be prescribed by your health care provider.  Birth control patch. The patch contains hormones similar to those in birth control pills. It must be changed once a week and is prescribed by a health care provider.  Vaginal ring. The ring contains hormones similar to those in birth control pills. It is left in the vagina for 3 weeks, removed for 1 week, and then a new one is put back in place. The patient must be comfortable inserting and removing the ring  from the vagina.A health care provider's prescription is necessary.  Emergency contraception. Emergency contraceptives prevent pregnancy after unprotected sexual intercourse. This pill can be taken right after sex or up to 5 days after unprotected sex. It is most effective the sooner you take the pills after having sexual intercourse. Most emergency contraceptive pills are available without a prescription. Check with your pharmacist. Do not use emergency contraception as your only form of birth control. Barrier methods  Female condom. This is a thin sheath (latex or rubber) that is worn over the penis during sexual intercourse. It can be used with spermicide to increase effectiveness.  Female condom. This is a soft, loose-fitting sheath that is put into the vagina before sexual intercourse.  Diaphragm. This is a soft, latex, dome-shaped barrier that must be fitted by a health care provider. It is inserted into the vagina, along with a spermicidal jelly. It is inserted before intercourse. The diaphragm should be left in the vagina for 6 to 8 hours after intercourse.  Cervical cap. This is a round, soft, latex or plastic cup that fits over the cervix and must be fitted by a health care provider. The cap can be left in place for up to 48 hours after intercourse.  Sponge. This is a soft, circular piece of polyurethane foam. The sponge has spermicide in it. It is inserted into the vagina after wetting it and before sexual intercourse.  Spermicides. These are chemicals that kill or block sperm from entering the cervix and uterus. They come in the form of creams, jellies, suppositories, foam, or tablets. They do not require a prescription. They  are inserted into the vagina with an applicator before having sexual intercourse. The process must be repeated every time you have sexual intercourse. Intrauterine contraception  Intrauterine device (IUD). This is a T-shaped device that is put in a woman's uterus  during a menstrual period to prevent pregnancy. There are 2 types:  Copper IUD. This type of IUD is wrapped in copper wire and is placed inside the uterus. Copper makes the uterus and fallopian tubes produce a fluid that kills sperm. It can stay in place for 10 years.  Hormone IUD. This type of IUD contains the hormone progestin (synthetic progesterone). The hormone thickens the cervical mucus and prevents sperm from entering the uterus, and it also thins the uterine lining to prevent implantation of a fertilized egg. The hormone can weaken or kill the sperm that get into the uterus. It can stay in place for 3-5 years, depending on which type of IUD is used. Permanent methods of contraception  Female tubal ligation. This is when the woman's fallopian tubes are surgically sealed, tied, or blocked to prevent the egg from traveling to the uterus.  Hysteroscopic sterilization. This involves placing a small coil or insert into each fallopian tube. Your doctor uses a technique called hysteroscopy to do the procedure. The device causes scar tissue to form. This results in permanent blockage of the fallopian tubes, so the sperm cannot fertilize the egg. It takes about 3 months after the procedure for the tubes to become blocked. You must use another form of birth control for these 3 months.  Female sterilization. This is when the female has the tubes that carry sperm tied off (vasectomy).This blocks sperm from entering the vagina during sexual intercourse. After the procedure, the man can still ejaculate fluid (semen). Natural planning methods  Natural family planning. This is not having sexual intercourse or using a barrier method (condom, diaphragm, cervical cap) on days the woman could become pregnant.  Calendar method. This is keeping track of the length of each menstrual cycle and identifying when you are fertile.  Ovulation method. This is avoiding sexual intercourse during ovulation.  Symptothermal  method. This is avoiding sexual intercourse during ovulation, using a thermometer and ovulation symptoms.  Post-ovulation method. This is timing sexual intercourse after you have ovulated. Regardless of which type or method of contraception you choose, it is important that you use condoms to protect against the transmission of sexually transmitted infections (STIs). Talk with your health care provider about which form of contraception is most appropriate for you. This information is not intended to replace advice given to you by your health care provider. Make sure you discuss any questions you have with your health care provider. Document Released: 12/05/2005 Document Revised: 05/12/2016 Document Reviewed: 05/30/2013 Elsevier Interactive Patient Education  2017 [employer]   Preventive Care 18-39 Years, Female Preventive care refers to lifestyle choices and visits with your health care provider that can promote health and wellness. What does preventive care include?  A yearly physical exam. This is also called an annual well check.  Dental exams once or twice a year.  Routine eye exams. Ask your health care provider how often you should have your eyes checked.  Personal lifestyle choices, including:  Daily care of your teeth and gums.  Regular physical activity.  Eating a healthy diet.  Avoiding tobacco and drug use.  Limiting alcohol use.  Practicing safe sex.  Taking vitamin and mineral supplements as recommended by your health care provider. What happens during   an annual well check? The services and screenings done by your health care provider during your annual well check will depend on your age, overall health, lifestyle risk factors, and family history of disease. Counseling  Your health care provider may ask you questions about your:  Alcohol use.  Tobacco use.  Drug use.  Emotional well-being.  Home and relationship well-being.  Sexual activity.  Eating  habits.  Work and work Statistician.  Method of birth control.  Menstrual cycle.  Pregnancy history. Screening  You may have the following tests or measurements:  Height, weight, and BMI.  Diabetes screening. This is done by checking your blood sugar (glucose) after you have not eaten for a while (fasting).  Blood pressure.  Lipid and cholesterol levels. These may be checked every 5 years starting at age 57.  Skin check.  Hepatitis C blood test.  Hepatitis B blood test.  Sexually transmitted disease (STD) testing.  BRCA-related cancer screening. This may be done if you have a family history of breast, ovarian, tubal, or peritoneal cancers.  Pelvic exam and Pap test. This may be done every 3 years starting at age 78. Starting at age 38, this may be done every 5 years if you have a Pap test in combination with an HPV test. Discuss your test results, treatment options, and if necessary, the need for more tests with your health care provider. Vaccines  Your health care provider may recommend certain vaccines, such as:  Influenza vaccine. This is recommended every year.  Tetanus, diphtheria, and acellular pertussis (Tdap, Td) vaccine. You may need a Td booster every 10 years.  Varicella vaccine. You may need this if you have not been vaccinated.  HPV vaccine. If you are 38 or younger, you may need three doses over 6 months.  Measles, mumps, and rubella (MMR) vaccine. You may need at least one dose of MMR. You may also need a second dose.  Pneumococcal 13-valent conjugate (PCV13) vaccine. You may need this if you have certain conditions and were not previously vaccinated.  Pneumococcal polysaccharide (PPSV23) vaccine. You may need one or two doses if you smoke cigarettes or if you have certain conditions.  Meningococcal vaccine. One dose is recommended if you are age 108-21 years and a first-year college student living in a residence hall, or if you have one of several  medical conditions. You may also need additional booster doses.  Hepatitis A vaccine. You may need this if you have certain conditions or if you travel or work in places where you may be exposed to hepatitis A.  Hepatitis B vaccine. You may need this if you have certain conditions or if you travel or work in places where you may be exposed to hepatitis B.  Haemophilus influenzae type b (Hib) vaccine. You may need this if you have certain risk factors. Talk to your health care provider about which screenings and vaccines you need and how often you need them. This information is not intended to replace advice given to you by your health care provider. Make sure you discuss any questions you have with your health care provider. Document Released: 01/31/2002 Document Revised: 08/24/2016 Document Reviewed: 10/06/2015 Elsevier Interactive Patient Education  2017 [employer].

## 2017-03-09 NOTE — Progress Notes (Signed)
Patient ID: Dawn Wall, female   DOB: May 30, 1976, 41 y.o.   MRN: [id number]     Gynecology Annual Exam  PCP: No PCP Per Patient  Chief Complaint:  Chief Complaint  Patient presents with  . Gynecologic Exam    History of Present Illness: Patient is a 41 y.o. G2P2002 presents for annual exam. The patient has complaints of hair loss and fatigue today. Pt has chronic hypertension and is on medication per [facility] MD  LMP: Patient's last menstrual period was 02/27/2017 (exact date). Menarche:not applicable Average Interval: occasional Duration of flow: several days Heavy Menses: no Clots: no Intermenstrual Bleeding: no Postcoital Bleeding: no Dysmenorrhea: no   The patient is sexually active. She currently uses Oral contraceptives: Present: Lo loestrin for contraception. She denies dyspareunia.  The patient does occasionally perform self breast exams.  There is no notable family history of breast or ovarian cancer in her family. Her maternal grandmother had breast malignancy diagnosed when she was 8160.  The patient wears seatbelts: yes.   The patient has regular exercise: yes.    The patient denies current symptoms of depression. She admits symptoms of anxiety and denies need for medication  Review of Systems: ROS  Past Medical History:  History reviewed. No pertinent past medical history.  Past Surgical History:  Past Surgical History:  Procedure Laterality Date  . LITHOTRIPSY      Gynecologic History:  Patient's last menstrual period was 02/27/2017 (exact date). Contraception: OCP (estrogen/progesterone) Last Pap: Results were: no abnormalities   Obstetric History: [id number]  Family History:  Family History  Problem Relation Age of Onset  . Breast cancer Maternal Grandmother 60    x3/lumpectomy  . Colon cancer Maternal Grandfather 6970    Social History:  Social History   Social History  . Marital status: Married    Spouse name: N/A  . Number  of children: N/A  . Years of education: N/A   Occupational History  . Not on file.   Social History Main Topics  . Smoking status: Never Smoker  . Smokeless tobacco: Never Used  . Alcohol use Yes     Comment: social  . Drug use: No  . Sexual activity: Yes    Birth control/ protection: Pill   Other Topics Concern  . Not on file   Social History Narrative  . No narrative on file    Allergies:  Allergies  Allergen Reactions  . Iodine Anaphylaxis, Hives and Rash  . Shellfish Allergy Anaphylaxis, Hives and Rash    Medications: Prior to Admission medications   Medication Sig Start Date End Date Taking? Authorizing Provider  fexofenadine (ALLEGRA) 180 MG tablet Take 180 mg by mouth daily.   Yes Historical Provider, MD  ibuprofen (ADVIL,MOTRIN) 200 MG tablet Take 600 mg by mouth every 6 (six) hours as needed. For pain   Yes Historical Provider, MD  lisinopril (PRINIVIL,ZESTRIL) 10 MG tablet Take 10 mg by mouth daily. 11/07/16 11/07/17 Yes Historical Provider, MD  fluticasone (FLONASE) 50 MCG/ACT nasal spray Place 2 sprays into the nose daily.    Historical Provider, MD  norethindrone-ethinyl estradiol (JUNEL FE,GILDESS FE,LOESTRIN FE) 1-20 MG-MCG tablet Take by mouth.    Historical Provider, MD  valACYclovir (VALTREX) 500 MG tablet Take 500 mg by mouth as needed. 03/02/17   Historical Provider, MD    Physical Exam Vitals: Blood pressure 122/82, pulse 99, height 5\' 1"  (1.549 m), weight 126 lb (57.2 kg), last menstrual period 02/27/2017.  General: NAD HEENT:  normocephalic, anicteric Thyroid: no enlargement, no palpable nodules Pulmonary: No increased work of breathing, CTAB Cardiovascular: RRR, distal pulses 2+ Breast: Breast symmetrical, no tenderness, no palpable nodules or masses, no skin or nipple retraction present, no nipple discharge.  No axillary or supraclavicular lymphadenopathy. Abdomen: NABS, soft, non-tender, non-distended.  Umbilicus without lesions.  No  hepatomegaly, splenomegaly or masses palpable. No evidence of hernia  Genitourinary:  External: Normal external female genitalia.  Normal urethral meatus, normal  Bartholin's and Skene's glands.    Vagina: Normal vaginal mucosa, no evidence of prolapse.    Cervix: Grossly normal in appearance, no bleeding  Uterus: Non-enlarged, mobile, normal contour.  No CMT  Adnexa: ovaries non-enlarged, no adnexal masses  Rectal: deferred  Lymphatic: no evidence of inguinal lymphadenopathy Extremities: no edema, erythema, or tenderness Neurologic: Grossly intact Psychiatric: mood appropriate, affect full   Assessment: 41 y.o. [id number] well woman with PAP smear   Plan: Problem List Items Addressed This Visit    None    Visit Diagnoses    Pap smear for cervical cancer screening    -  Primary   Relevant Orders   IGP, Aptima HPV, rfx 16/18,45   Hair loss       Relevant Orders   TSH + free T4 (Completed)   Fatigue, unspecified type       Relevant Orders   TSH + free T4 (Completed)   Encounter for gynecological examination without abnormal finding       Relevant Orders   IGP, Aptima HPV, rfx 16/18,45   TSH + free T4 (Completed)      1) Thyroid screen today for pt symptoms  2) ASCCP guidelines and rational discussed.  Patient opts for every 3 years screening interval  3) Contraception - Education given regarding options for contraception, including IUD and Nexplanon. Patient chooses to continue on current OCP at this time and may consider an IUD if insurance will cover  4) Healthy lifestyle encouraged- diet and exercise  5) Follow up 1 year for routine annual exam   Tresea Mall, CNM

## 2017-03-10 LAB — TSH+FREE T4
FREE T4: 1.35 ng/dL (ref 0.82–1.77)
TSH: 0.491 u[IU]/mL (ref 0.450–4.500)

## 2017-03-13 ENCOUNTER — Telehealth: Payer: Self-pay

## 2017-03-13 NOTE — Telephone Encounter (Signed)
Pt calling for advice and has question about IUD JEG rec.  [phone].

## 2017-03-14 ENCOUNTER — Telehealth: Payer: Self-pay

## 2017-03-14 NOTE — Telephone Encounter (Signed)
[facility]  [phone].

## 2017-03-15 NOTE — Telephone Encounter (Signed)
Pt is calling to find out her lab result. Please Callback

## 2017-03-15 NOTE — Telephone Encounter (Signed)
Please let me know when pt callsback ?

## 2017-03-20 LAB — IGP, APTIMA HPV
HPV Aptima: NEGATIVE
PAP Smear Comment: 0

## 2017-03-23 ENCOUNTER — Encounter: Payer: Self-pay | Admitting: Certified Nurse Midwife

## 2017-03-27 ENCOUNTER — Telehealth: Payer: Self-pay

## 2017-03-27 NOTE — Telephone Encounter (Signed)
Pt called.  Was in a few weeks ago, bc options discussed.  Ins will not cover Lo Loestrin but will cover anything generic completely.  [phone]

## 2017-03-28 NOTE — Telephone Encounter (Signed)
Pt is calling back and stating their isn't an Generic for her LO estrin Birthcontrol. She also hasn't heard about her lab result and would like some one to speak with her please. CB# [phone]

## 2017-03-29 MED ORDER — NORETHIN ACE-ETH ESTRAD-FE 1-20 MG-MCG PO TABS: 1.0000 | ORAL_TABLET | Freq: Every day | ORAL | 11 refills | Status: DC

## 2017-03-29 NOTE — Telephone Encounter (Signed)
Called pt back to tell her Rx sent for generic BC. Left message regarding lab results for a second time.

## 2017-03-29 NOTE — Addendum Note (Signed)
Addended by: Tresea Mall on: 03/29/2017 08:55 AM   Modules accepted: Orders

## 2017-12-21 ENCOUNTER — Other Ambulatory Visit: Payer: Self-pay | Admitting: Advanced Practice Midwife

## 2017-12-21 DIAGNOSIS — Z308 Encounter for other contraceptive management: Secondary | ICD-10-CM

## 2018-03-23 ENCOUNTER — Other Ambulatory Visit: Payer: Self-pay | Admitting: Advanced Practice Midwife

## 2018-03-23 DIAGNOSIS — Z308 Encounter for other contraceptive management: Secondary | ICD-10-CM

## 2018-04-11 ENCOUNTER — Encounter: Payer: Self-pay | Admitting: Advanced Practice Midwife

## 2018-04-11 ENCOUNTER — Ambulatory Visit (INDEPENDENT_AMBULATORY_CARE_PROVIDER_SITE_OTHER): Payer: BLUE CROSS/BLUE SHIELD | Admitting: Advanced Practice Midwife

## 2018-04-11 VITALS — BP 114/72 | HR 86 | Ht 61.0 in | Wt 139.0 lb

## 2018-04-11 DIAGNOSIS — Z124 Encounter for screening for malignant neoplasm of cervix: Secondary | ICD-10-CM

## 2018-04-11 DIAGNOSIS — Z308 Encounter for other contraceptive management: Secondary | ICD-10-CM | POA: Diagnosis not present

## 2018-04-11 DIAGNOSIS — B009 Herpesviral infection, unspecified: Secondary | ICD-10-CM | POA: Diagnosis not present

## 2018-04-11 DIAGNOSIS — Z01419 Encounter for gynecological examination (general) (routine) without abnormal findings: Secondary | ICD-10-CM

## 2018-04-11 MED ORDER — VALACYCLOVIR HCL 500 MG PO TABS: 500.0000 mg | ORAL_TABLET | Freq: Every day | ORAL | 11 refills | Status: DC

## 2018-04-11 MED ORDER — NORETHIN ACE-ETH ESTRAD-FE 1-20 MG-MCG PO TABS: 1.0000 | ORAL_TABLET | Freq: Every day | ORAL | 11 refills | Status: DC

## 2018-04-11 NOTE — Progress Notes (Signed)
Dawn Wall ID: Dawn Wall, female   DOB: November 26, 1976, 42 y.o.   MRN: [id number]    Gynecology Annual Exam  PCP: Dawn Wall, Dawn Wall  Chief Complaint:  Chief Complaint  Dawn Wall presents with  . Gynecologic Exam    Migraines w/cycles    History of Present Illness: Dawn Wall is a 42 y.o. [id number] presents for annual exam. The Dawn Wall has complaints today of migraines associated with her cycle although she is having them now at time of ovulation. She was started on wellbutrin and buspar by her PCP to see if she gets relief from them that way. She also has imitrex for break through pain but doesn't feel well when she takes that. She requests a refill of valtrex today. 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 She also needs refill of birth control. Her PAP smear last year was ASCUS/negative HPV.   LMP: Dawn Wall's last menstrual period was 03/19/2018 (exact date). Average Interval: regular, 28 days Duration of flow: 2 days Heavy Menses: Dawn Clots: Dawn Intermenstrual Bleeding: Dawn Postcoital Bleeding: Dawn Dysmenorrhea: Dawn   The Dawn Wall is sexually active. She currently uses OCP (estrogen/progesterone) for contraception. She denies dyspareunia.  The Dawn Wall does perform self breast exams.  There is Dawn notable family history of breast or ovarian cancer in her family.  The Dawn Wall wears seatbelts: yes.   The Dawn Wall has regular exercise: yes.  She admits generally healthy lifestyle of diet, hydration, exercise, adequate sleep, Dawn smoking. She does not like vegetables, however.   The Dawn Wall denies current symptoms of depression.    Review of Systems: Review of Systems  Constitutional: Negative.   HENT: Negative.   Eyes: Negative.   Respiratory: Negative.   Cardiovascular: Negative.   Gastrointestinal: Negative.   Genitourinary: Negative.   Musculoskeletal: Negative.   Skin: Negative.   Neurological: Negative.     Endo/Heme/Allergies: Negative.   Psychiatric/Behavioral: Negative.     Past Medical History:  History reviewed. Dawn pertinent past medical history.  Past Surgical History:  Past Surgical History:  Procedure Laterality Date  . LITHOTRIPSY      Gynecologic History:  Dawn Wall's last menstrual period was 03/19/2018 (exact date). Contraception: OCP (estrogen/progesterone) Last Pap: 1 year ago Results were:  Dawn abnormalities  Last mammogram: 2 months ago Results were: benign  Obstetric History: [id number]  Family History:  Family History  Problem Relation Age of Onset  . Breast cancer Maternal Grandmother 60       x3/lumpectomy  . Colon cancer Maternal Grandfather [id number]    Social History:  Social History   Socioeconomic History  . Marital status: Married    Spouse name: Not on file  . Number of children: Not on file  . Years of education: Not on file  . Highest education level: Not on file  Occupational History  . Not on file  Social Needs  . Financial resource strain: Not on file  . Food insecurity:    Worry: Not on file    Inability: Not on file  . Transportation needs:    Medical: Not on file    Non-medical: Not on file  Tobacco Use  . Smoking status: Never Smoker  . Smokeless tobacco: Never Used  Substance and Sexual Activity  . Alcohol use: Yes    Comment: social  . Drug use: Dawn  . Sexual activity: Yes    Birth control/protection: Pill  Lifestyle  . Physical activity:  Days Wall week: Not on file    Minutes Wall session: Not on file  . Stress: Not on file  Relationships  . Social connections:    Talks on phone: Not on file    Gets together: Not on file    Attends religious service: Not on file    Active member of club or organization: Not on file    Attends meetings of clubs or organizations: Not on file    Relationship status: Not on file  . Intimate partner violence:    Fear of current or ex partner: Not on file    Emotionally abused: Not on file     Physically abused: Not on file    Forced sexual activity: Not on file  Other Topics Concern  . Not on file  Social History Narrative  . Not on file    Allergies:  Allergies  Allergen Reactions  . Iodine Anaphylaxis, Hives and Rash  . Shellfish Allergy Anaphylaxis, Hives and Rash    Medications: Prior to Admission medications   Medication Sig Start Date End Date Taking? Authorizing Provider  buPROPion (WELLBUTRIN XL) 150 MG 24 hr tablet Take 150 mg by mouth daily. 03/28/18  Yes [provider]  busPIRone (BUSPAR) 10 MG tablet TAKE 1 TABLET BY MOUTH TWICE A DAY AS NEEDED FOR ANXIETY 03/29/18  Yes [provider]  fexofenadine (ALLEGRA) 180 MG tablet Take 180 mg by mouth daily.   Yes [provider]  fluticasone (FLONASE) 50 MCG/ACT nasal spray Place 2 sprays into the nose daily.   Yes [provider]  ibuprofen (ADVIL,MOTRIN) 200 MG tablet Take 600 mg by mouth every 6 (six) hours as needed. For pain   Yes [provider]  JUNEL FE 1/20 1-20 MG-MCG tablet TAKE 1 TABLET BY MOUTH EVERY DAY 12/21/17  Yes Tresea Mall, CNM  metoprolol succinate (TOPROL-XL) 25 MG 24 hr tablet Take 25 mg by mouth daily. 01/29/18  Yes [provider]  valACYclovir (VALTREX) 500 MG tablet Take 500 mg by mouth as needed. 03/02/17  Yes [provider]    Physical Exam Vitals: Blood pressure 114/72, pulse 86, height 5\' 1"  (1.549 m), weight 139 lb (63 kg), last menstrual period 03/19/2018.  General: NAD HEENT: normocephalic, anicteric Thyroid: Dawn enlargement, Dawn palpable nodules Pulmonary: Dawn increased work of breathing, CTAB Cardiovascular: RRR, distal pulses 2+ Breast: Breast symmetrical, Dawn tenderness, Dawn palpable nodules or masses, Dawn skin or nipple retraction present, Dawn nipple discharge.  Dawn axillary or supraclavicular lymphadenopathy. Abdomen: NABS, soft, non-tender, non-distended.  Umbilicus without lesions.  Dawn hepatomegaly, splenomegaly or  masses palpable. Dawn evidence of hernia  Genitourinary:  External: Normal external female genitalia.  Normal urethral meatus, normal Bartholin's and Skene's glands.    Vagina: Normal vaginal mucosa, Dawn evidence of prolapse.    Cervix: Grossly normal in appearance, Dawn bleeding, Dawn CMT  Uterus: deferred for Dawn concerns/shared decision making   Adnexa: deferred for Dawn concerns/shared decision making  Rectal: deferred  Lymphatic: Dawn evidence of inguinal lymphadenopathy Extremities: Dawn edema, erythema, or tenderness Neurologic: Grossly intact Psychiatric: mood appropriate, affect full   Assessment: 42 y.o. [id number] routine annual exam  Plan: Problem List Items Addressed This Visit    None    Visit Diagnoses    Well woman exam with routine gynecological exam    -  Primary   Relevant Orders   IGP, Aptima HPV   Cervical cancer screening       Relevant Orders   IGP,  Aptima HPV      1) Mammogram - recommend yearly screening mammogram.  Mammogram Is up to date   2) STI screening  was offered and declined  3) ASCCP guidelines and rational discussed.  Dawn Wall opts for yearly screening interval  4) Contraception - the Dawn Wall is currently using  OCP (estrogen/progesterone).  She is happy with her current form of contraception and plans to continue  5) Colonoscopy -- Screening recommended starting at age 15 for average risk individuals, age 64 for individuals deemed at increased risk (including African Americans) and recommended to continue until age 74.  For Dawn Wall age 62-85 individualized approach is recommended.  Gold standard screening is via colonoscopy, Cologuard screening is an acceptable alternative for Dawn Wall unwilling or unable to undergo colonoscopy.  "Colorectal cancer screening for average?risk adults: 2018 guideline update from the American Cancer Society"CA: A Cancer Journal for Clinicians: May 17, 2017   6) Routine healthcare maintenance including cholesterol, diabetes  screening discussed managed by PCP  7) Return in 1 year (on 04/12/2019) for annual established gyn.   Tresea Mall, CNM [facility], [facility] 04/11/2018, 8:55 AM

## 2018-04-11 NOTE — Patient Instructions (Signed)
American Heart Association (AHA) Exercise Recommendation  Being physically active is important to prevent heart disease and stroke, the nation's No. 1and No. 5killers. To improve overall cardiovascular health, we suggest at least 150 minutes per week of moderate exercise or 75 minutes per week of vigorous exercise (or a combination of moderate and vigorous activity). Thirty minutes a day, five times a week is an easy goal to remember. You will also experience benefits even if you divide your time into two or three segments of 10 to 15 minutes per day.  For people who would benefit from lowering their blood pressure or cholesterol, we recommend 40 minutes of aerobic exercise of moderate to vigorous intensity three to four times a week to lower the risk for heart attack and stroke.  Physical activity is anything that makes you move your body and burn calories.  This includes things like climbing stairs or playing sports. Aerobic exercises benefit your heart, and include walking, jogging, swimming or biking. Strength and stretching exercises are best for overall stamina and flexibility.  The simplest, positive change you can make to effectively improve your heart health is to start walking. It's enjoyable, free, easy, social and great exercise. A walking program is flexible and boasts high success rates because people can stick with it. It's easy for walking to become a regular and satisfying part of life.   For Overall Cardiovascular Health:  At least 30 minutes of moderate-intensity aerobic activity at least 5 days per week for a total of 150  OR   At least 25 minutes of vigorous aerobic activity at least 3 days per week for a total of 75 minutes; or a combination of moderate- and vigorous-intensity aerobic activity  AND   Moderate- to high-intensity muscle-strengthening activity at least 2 days per week for additional health benefits.  For Lowering Blood Pressure and Cholesterol  An  average 40 minutes of moderate- to vigorous-intensity aerobic activity 3 or 4 times per week  What if I can't make it to the time goal? Something is always better than nothing! And everyone has to start somewhere. Even if you've been sedentary for years, today is the day you can begin to make healthy changes in your life. If you don't think you'll make it for 30 or 40 minutes, set a reachable goal for today. You can work up toward your overall goal by increasing your time as you get stronger. Don't let all-or-nothing thinking rob you of doing what you can every day.  Source:http://www.heart.org   Mediterranean Diet A Mediterranean diet refers to food and lifestyle choices that are based on the traditions of countries located on the [employer]. This way of eating has been shown to help prevent certain conditions and improve outcomes for people who have chronic diseases, like kidney disease and heart disease. What are tips for following this plan? Lifestyle  Cook and eat meals together with your family, when possible.  Drink enough fluid to keep your urine clear or pale yellow.  Be physically active every day. This includes: ? Aerobic exercise like running or swimming. ? Leisure activities like gardening, walking, or housework.  Get 7-8 hours of sleep each night.  If recommended by your health care provider, drink red wine in moderation. This means 1 glass a day for nonpregnant women and 2 glasses a day for men. A glass of wine equals 5 oz (150 mL). Reading food labels  Check the serving size of packaged foods. For foods such as rice  and pasta, the serving size refers to the amount of cooked product, not dry.  Check the total fat in packaged foods. Avoid foods that have saturated fat or trans fats.  Check the ingredients list for added sugars, such as corn syrup. Shopping  At the grocery store, buy most of your food from the areas near the walls of the store. This  includes: ? Fresh fruits and vegetables (produce). ? Grains, beans, nuts, and seeds. Some of these may be available in unpackaged forms or large amounts (in bulk). ? Fresh seafood. ? Poultry and eggs. ? Low-fat dairy products.  Buy whole ingredients instead of prepackaged foods.  Buy fresh fruits and vegetables in-season from local farmers markets.  Buy frozen fruits and vegetables in resealable bags.  If you do not have access to quality fresh seafood, buy precooked frozen shrimp or canned fish, such as tuna, salmon, or sardines.  Buy small amounts of raw or cooked vegetables, salads, or olives from the deli or salad bar at your store.  Stock your pantry so you always have certain foods on hand, such as olive oil, canned tuna, canned tomatoes, rice, pasta, and beans. Cooking  Cook foods with extra-virgin olive oil instead of using butter or other vegetable oils.  Have meat as a side dish, and have vegetables or grains as your main dish. This means having meat in small portions or adding small amounts of meat to foods like pasta or stew.  Use beans or vegetables instead of meat in common dishes like chili or lasagna.  Experiment with different cooking methods. Try roasting or broiling vegetables instead of steaming or sauteing them.  Add frozen vegetables to soups, stews, pasta, or rice.  Add nuts or seeds for added healthy fat at each meal. You can add these to yogurt, salads, or vegetable dishes.  Marinate fish or vegetables using olive oil, lemon juice, garlic, and fresh herbs. Meal planning  Plan to eat 1 vegetarian meal one day each week. Try to work up to 2 vegetarian meals, if possible.  Eat seafood 2 or more times a week.  Have healthy snacks readily available, such as: ? Vegetable sticks with hummus. ? [place] yogurt. ? Fruit and nut trail mix.  Eat balanced meals throughout the week. This includes: ? Fruit: 2-3 servings a day ? Vegetables: 4-5 servings a  day ? Low-fat dairy: 2 servings a day ? Fish, poultry, or lean meat: 1 serving a day ? Beans and legumes: 2 or more servings a week ? Nuts and seeds: 1-2 servings a day ? Whole grains: 6-8 servings a day ? Extra-virgin olive oil: 3-4 servings a day  Limit red meat and sweets to only a few servings a month What are my food choices?  Mediterranean diet ? Recommended ? Grains: Whole-grain pasta. Brown rice. Bulgar wheat. Polenta. Couscous. Whole-wheat bread. [place]. ? Vegetables: Artichokes. Beets. Broccoli. Cabbage. Carrots. Eggplant. Green beans. Chard. Kale. Spinach. Onions. Leeks. Peas. Squash. Tomatoes. Peppers. Radishes. ? Fruits: Apples. Apricots. Avocado. Berries. Bananas. Cherries. Dates. Figs. Grapes. Lemons. Melon. Oranges. Peaches. Plums. Pomegranate. ? Meats and other protein foods: Beans. Almonds. Sunflower seeds. Pine nuts. Peanuts. [place]. Salmon. Scallops. Shrimp. [place]. Tilapia. Clams. Oysters. Eggs. ? Dairy: Low-fat milk. Cheese. Greek yogurt. ? Beverages: Water. Red wine. Herbal tea. ? Fats and oils: Extra virgin olive oil. Avocado oil. Grape seed oil. ? Sweets and desserts: [place] yogurt with honey. Baked apples. Poached pears. Trail mix. ? Seasoning and other foods: Basil. Cilantro. Coriander.  Cumin. Mint. Parsley. Sage. Rosemary. Tarragon. Garlic. Oregano. Thyme. Pepper. Balsalmic vinegar. Tahini. Hummus. Tomato sauce. Olives. Mushrooms. ? Limit these ? Grains: Prepackaged pasta or rice dishes. Prepackaged cereal with added sugar. ? Vegetables: Deep fried potatoes (french fries). ? Fruits: Fruit canned in syrup. ? Meats and other protein foods: Beef. Pork. Lamb. Poultry with skin. Hot dogs. Bacon. ? Dairy: Ice cream. Sour cream. Whole milk. ? Beverages: Juice. Sugar-sweetened soft drinks. Beer. Liquor and spirits. ? Fats and oils: Butter. Canola oil. Vegetable oil. Beef fat (tallow). Lard. ? Sweets and desserts: Cookies. Cakes. Pies. Candy. ? Seasoning and other  foods: Mayonnaise. Premade sauces and marinades. ? The items listed may not be a complete list. Talk with your dietitian about what dietary choices are right for you. Summary  The Mediterranean diet includes both food and lifestyle choices.  Eat a variety of fresh fruits and vegetables, beans, nuts, seeds, and whole grains.  Limit the amount of red meat and sweets that you eat.  Talk with your health care provider about whether it is safe for you to drink red wine in moderation. This means 1 glass a day for nonpregnant women and 2 glasses a day for men. A glass of wine equals 5 oz (150 mL). This information is not intended to replace advice given to you by your health care provider. Make sure you discuss any questions you have with your health care provider. Document Released: 07/28/2016 Document Revised: 08/30/2016 Document Reviewed: 07/28/2016 Elsevier Interactive Patient Education  2018 [employer] Inc[employer] Health Maintenance, Female Adopting a healthy lifestyle and getting preventive care can go a long way to promote health and wellness. Talk with your health care provider about what schedule of regular examinations is right for you. This is a good chance for you to check in with your provider about disease prevention and staying healthy. In between checkups, there are plenty of things you can do on your own. Experts have done a lot of research about which lifestyle changes and preventive measures are most likely to keep you healthy. Ask your health care provider for more information. Weight and diet Eat a healthy diet  Be sure to include plenty of vegetables, fruits, low-fat dairy products, and lean protein.  Do not eat a lot of foods high in solid fats, added sugars, or salt.  Get regular exercise. This is one of the most important things you can do for your health. ? Most adults should exercise for at least 150 minutes each week. The exercise should increase your heart rate and make you  sweat (moderate-intensity exercise). ? Most adults should also do strengthening exercises at least twice a week. This is in addition to the moderate-intensity exercise.  Maintain a healthy weight  Body mass index (BMI) is a measurement that can be used to identify possible weight problems. It estimates body fat based on height and weight. Your health care provider can help determine your BMI and help you achieve or maintain a healthy weight.  For females 20 years of age and older: ? A BMI below 18.5 is considered underweight. ? A BMI of 18.5 to 24.9 is normal. ? A BMI of 25 to 29.9 is considered overweight. ? A BMI of 30 and above is considered obese.  Watch levels of cholesterol and blood lipids  You should start having your blood tested for lipids and cholesterol at 41 years of age, then have this test every 5 years.  You may need to have your cholesterol levels   checked more often if: ? Your lipid or cholesterol levels are high. ? You are older than 41 years of age. ? You are at high risk for heart disease.  Cancer screening Lung Cancer  Lung cancer screening is recommended for adults 34-81 years old who are at high risk for lung cancer because of a history of smoking.  A yearly low-dose CT scan of the lungs is recommended for people who: ? Currently smoke. ? Have quit within the past 15 years. ? Have at least a 30-pack-year history of smoking. A pack year is smoking an average of one pack of cigarettes a day for 1 year.  Yearly screening should continue until it has been 15 years since you quit.  Yearly screening should stop if you develop a health problem that would prevent you from having lung cancer treatment.  Breast Cancer  Practice breast self-awareness. This means understanding how your breasts normally appear and feel.  It also means doing regular breast self-exams. Let your health care provider know about any changes, no matter how small.  If you are in your 20s  or 30s, you should have a clinical breast exam (CBE) by a health care provider every 1-3 years as part of a regular health exam.  If you are 62 or older, have a CBE every year. Also consider having a breast X-ray (mammogram) every year.  If you have a family history of breast cancer, talk to your health care provider about genetic screening.  If you are at high risk for breast cancer, talk to your health care provider about having an MRI and a mammogram every year.  Breast cancer gene (BRCA) assessment is recommended for women who have family members with BRCA-related cancers. BRCA-related cancers include: ? Breast. ? Ovarian. ? Tubal. ? Peritoneal cancers.  Results of the assessment will determine the need for genetic counseling and BRCA1 and BRCA2 testing.  Cervical Cancer Your health care provider may recommend that you be screened regularly for cancer of the pelvic organs (ovaries, uterus, and vagina). This screening involves a pelvic examination, including checking for microscopic changes to the surface of your cervix (Pap test). You may be encouraged to have this screening done every 3 years, beginning at age 8.  For women ages 52-65, health care providers may recommend pelvic exams and Pap testing every 3 years, or they may recommend the Pap and pelvic exam, combined with testing for human papilloma virus (HPV), every 5 years. Some types of HPV increase your risk of cervical cancer. Testing for HPV may also be done on women of any age with unclear Pap test results.  Other health care providers may not recommend any screening for nonpregnant women who are considered low risk for pelvic cancer and who do not have symptoms. Ask your health care provider if a screening pelvic exam is right for you.  If you have had past treatment for cervical cancer or a condition that could lead to cancer, you need Pap tests and screening for cancer for at least 20 years after your treatment. If Pap tests  have been discontinued, your risk factors (such as having a new sexual partner) need to be reassessed to determine if screening should resume. Some women have medical problems that increase the chance of getting cervical cancer. In these cases, your health care provider may recommend more frequent screening and Pap tests.  Colorectal Cancer  This type of cancer can be detected and often prevented.  Routine colorectal cancer screening  usually begins at 41 years of age and continues through 41 years of age.  Your health care provider may recommend screening at an earlier age if you have risk factors for colon cancer.  Your health care provider may also recommend using home test kits to check for hidden blood in the stool.  A small camera at the end of a tube can be used to examine your colon directly (sigmoidoscopy or colonoscopy). This is done to check for the earliest forms of colorectal cancer.  Routine screening usually begins at age 8.  Direct examination of the colon should be repeated every 5-10 years through 41 years of age. However, you may need to be screened more often if early forms of precancerous polyps or small growths are found.  Skin Cancer  Check your skin from head to toe regularly.  Tell your health care provider about any new moles or changes in moles, especially if there is a change in a mole's shape or color.  Also tell your health care provider if you have a mole that is larger than the size of a pencil eraser.  Always use sunscreen. Apply sunscreen liberally and repeatedly throughout the day.  Protect yourself by wearing long sleeves, pants, a wide-brimmed hat, and sunglasses whenever you are outside.  Heart disease, diabetes, and high blood pressure  High blood pressure causes heart disease and increases the risk of stroke. High blood pressure is more likely to develop in: ? People who have blood pressure in the high end of the normal range (130-139/85-89 mm  Hg). ? People who are overweight or obese. ? People who are African American.  If you are 29-23 years of age, have your blood pressure checked every 3-5 years. If you are 68 years of age or older, have your blood pressure checked every year. You should have your blood pressure measured twice-once when you are at a hospital or clinic, and once when you are not at a hospital or clinic. Record the average of the two measurements. To check your blood pressure when you are not at a hospital or clinic, you can use: ? An automated blood pressure machine at a pharmacy. ? A home blood pressure monitor.  If you are between 82 years and 71 years old, ask your health care provider if you should take aspirin to prevent strokes.  Have regular diabetes screenings. This involves taking a blood sample to check your fasting blood sugar level. ? If you are at a normal weight and have a low risk for diabetes, have this test once every three years after 41 years of age. ? If you are overweight and have a high risk for diabetes, consider being tested at a younger age or more often. Preventing infection Hepatitis B  If you have a higher risk for hepatitis B, you should be screened for this virus. You are considered at high risk for hepatitis B if: ? You were born in a country where hepatitis B is common. Ask your health care provider which countries are considered high risk. ? Your parents were born in a high-risk country, and you have not been immunized against hepatitis B (hepatitis B vaccine). ? You have HIV or AIDS. ? You use needles to inject street drugs. ? You live with someone who has hepatitis B. ? You have had sex with someone who has hepatitis B. ? You get hemodialysis treatment. ? You take certain medicines for conditions, including cancer, organ transplantation, and autoimmune conditions.  Hepatitis C  Blood testing is recommended for: ? Everyone born from [birth year] through 1964. ? Anyone with known  risk factors for hepatitis C.  Sexually transmitted infections (STIs)  You should be screened for sexually transmitted infections (STIs) including gonorrhea and chlamydia if: ? You are sexually active and are younger than 41 years of age. ? You are older than 41 years of age and your health care provider tells you that you are at risk for this type of infection. ? Your sexual activity has changed since you were last screened and you are at an increased risk for chlamydia or gonorrhea. Ask your health care provider if you are at risk.  If you do not have HIV, but are at risk, it may be recommended that you take a prescription medicine daily to prevent HIV infection. This is called pre-exposure prophylaxis (PrEP). You are considered at risk if: ? You are sexually active and do not regularly use condoms or know the HIV status of your partner(s). ? You take drugs by injection. ? You are sexually active with a partner who has HIV.  Talk with your health care provider about whether you are at high risk of being infected with HIV. If you choose to begin PrEP, you should first be tested for HIV. You should then be tested every 3 months for as long as you are taking PrEP. Pregnancy  If you are premenopausal and you may become pregnant, ask your health care provider about preconception counseling.  If you may become pregnant, take 400 to 800 micrograms (mcg) of folic acid every day.  If you want to prevent pregnancy, talk to your health care provider about birth control (contraception). Osteoporosis and menopause  Osteoporosis is a disease in which the bones lose minerals and strength with aging. This can result in serious bone fractures. Your risk for osteoporosis can be identified using a bone density scan.  If you are 74 years of age or older, or if you are at risk for osteoporosis and fractures, ask your health care provider if you should be screened.  Ask your health care provider whether you  should take a calcium or vitamin D supplement to lower your risk for osteoporosis.  Menopause may have certain physical symptoms and risks.  Hormone replacement therapy may reduce some of these symptoms and risks. Talk to your health care provider about whether hormone replacement therapy is right for you. Follow these instructions at home:  Schedule regular health, dental, and eye exams.  Stay current with your immunizations.  Do not use any tobacco products including cigarettes, chewing tobacco, or electronic cigarettes.  If you are pregnant, do not drink alcohol.  If you are breastfeeding, limit how much and how often you drink alcohol.  Limit alcohol intake to no more than 1 drink per day for nonpregnant women. One drink equals 12 ounces of beer, 5 ounces of wine, or 1 ounces of hard liquor.  Do not use street drugs.  Do not share needles.  Ask your health care provider for help if you need support or information about quitting drugs.  Tell your health care provider if you often feel depressed.  Tell your health care provider if you have ever been abused or do not feel safe at home. This information is not intended to replace advice given to you by your health care provider. Make sure you discuss any questions you have with your health care provider. Document Released: 06/20/2011 Document Revised: 05/12/2016 Document Reviewed:  09/08/2015 Elsevier Interactive Patient Education  [employer].

## 2018-04-14 LAB — IGP, APTIMA HPV
HPV Aptima: NEGATIVE
PAP Smear Comment: 0

## 2019-02-28 ENCOUNTER — Emergency Department: Payer: BLUE CROSS/BLUE SHIELD

## 2019-02-28 ENCOUNTER — Emergency Department
Admission: EM | Admit: 2019-02-28 | Discharge: 2019-02-28 | Disposition: A | Discharge status: Home / Self Care | Payer: BLUE CROSS/BLUE SHIELD | Attending: Emergency Medicine | Admitting: Emergency Medicine

## 2019-02-28 ENCOUNTER — Other Ambulatory Visit: Payer: Self-pay

## 2019-02-28 DIAGNOSIS — Z79899 Other long term (current) drug therapy: Secondary | ICD-10-CM | POA: Insufficient documentation

## 2019-02-28 DIAGNOSIS — R059 Cough, unspecified: Secondary | ICD-10-CM

## 2019-02-28 DIAGNOSIS — R05 Cough: Secondary | ICD-10-CM | POA: Diagnosis present

## 2019-02-28 DIAGNOSIS — J101 Influenza due to other identified influenza virus with other respiratory manifestations: Secondary | ICD-10-CM | POA: Insufficient documentation

## 2019-02-28 LAB — COMPREHENSIVE METABOLIC PANEL WITH GFR
ALT: 12 U/L (ref 0–44)
AST: 15 U/L (ref 15–41)
Albumin: 4.2 g/dL (ref 3.5–5.0)
Alkaline Phosphatase: 47 U/L (ref 38–126)
Anion gap: 8 (ref 5–15)
BUN: 9 mg/dL (ref 6–20)
CO2: 21 mmol/L — ABNORMAL LOW (ref 22–32)
Calcium: 8.9 mg/dL (ref 8.9–10.3)
Chloride: 106 mmol/L (ref 98–111)
Creatinine, Ser: 0.6 mg/dL (ref 0.44–1.00)
GFR calc Af Amer: >60 mL/min
GFR calc non Af Amer: >60 mL/min
Glucose, Bld: 100 mg/dL — ABNORMAL HIGH (ref 70–99)
Potassium: 3.7 mmol/L (ref 3.5–5.1)
Sodium: 135 mmol/L (ref 135–145)
Total Bilirubin: 0.5 mg/dL (ref 0.3–1.2)
Total Protein: 7.6 g/dL (ref 6.5–8.1)

## 2019-02-28 LAB — CBC WITH DIFFERENTIAL/PLATELET
Abs Immature Granulocytes: 0.03 10*3/uL (ref 0.00–0.07)
Basophils Absolute: 0 10*3/uL (ref 0.0–0.1)
Basophils Relative: 1 %
Eosinophils Absolute: 0.1 10*3/uL (ref 0.0–0.5)
Eosinophils Relative: 2 %
HCT: 42 % (ref 36.0–46.0)
Hemoglobin: 14.6 g/dL (ref 12.0–15.0)
Immature Granulocytes: 0 %
Lymphocytes Relative: 12 %
Lymphs Abs: 0.9 10*3/uL (ref 0.7–4.0)
MCH: 31.1 pg (ref 26.0–34.0)
MCHC: 34.8 g/dL (ref 30.0–36.0)
MCV: 89.4 fL (ref 80.0–100.0)
Monocytes Absolute: 0.6 10*3/uL (ref 0.1–1.0)
Monocytes Relative: 8 %
NEUTROS PCT: 77 %
Neutro Abs: 6 10*3/uL (ref 1.7–7.7)
Platelets: 204 10*3/uL (ref 150–400)
RBC: 4.7 MIL/uL (ref 3.87–5.11)
RDW: 11.7 % (ref 11.5–15.5)
WBC: 7.7 10*3/uL (ref 4.0–10.5)
nRBC: 0 % (ref 0.0–0.2)

## 2019-02-28 LAB — INFLUENZA PANEL BY PCR (TYPE A & B)
Influenza A By PCR: POSITIVE — AB
Influenza B By PCR: NEGATIVE

## 2019-02-28 LAB — TROPONIN I

## 2019-02-28 LAB — HCG, QUANTITATIVE, PREGNANCY

## 2019-02-28 MED ORDER — KETOROLAC TROMETHAMINE 30 MG/ML IJ SOLN
15.0000 mg | Freq: Once | INTRAMUSCULAR | Status: AC
  Administered 2019-02-28: 15 mg via INTRAVENOUS
  Filled 2019-02-28: qty 1

## 2019-02-28 MED ORDER — SODIUM CHLORIDE 0.9 % IV BOLUS
1000.0000 mL | Freq: Once | INTRAVENOUS | Status: AC
  Administered 2019-02-28: 1000 mL via INTRAVENOUS

## 2019-02-28 NOTE — ED Triage Notes (Signed)
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

## 2019-02-28 NOTE — ED Notes (Signed)
Contact/Airborne precautions attached to door for all staff. This RN to sign in and out of room with every visit.

## 2019-02-28 NOTE — ED Notes (Signed)
Pt up to bedside toilet.  

## 2019-02-28 NOTE — ED Notes (Signed)
Pt given phone and number to RN directly so as to minimize visits to room. Pt/family updated on plans.

## 2019-02-28 NOTE — ED Provider Notes (Addendum)
[facility] Emergency Department Provider Note  ____________________________________________  Time seen: Approximately 8:56 PM  I have reviewed the triage vital signs and the nursing notes.   HISTORY  Chief Complaint Cough   HPI Dawn Wall is a 43 y.o. female with no significant past medical history who presents for evaluation of flulike symptoms.  Patient reports productive cough, congestion, low-grade fever of 99, body aches, shortness of breath, and chest pressure x 5 days. Symptoms have become worse. 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  No recent travel or known sick contact exposures.  She went to urgent care where she had a negative flu swab and a normal chest x-ray.  She was given antibiotics and sent home.  She reports that she called the health department because she was concerned for possible coronavirus and was instructed to come to the emergency room for testing.  Patient denies any history of asthma or COPD, personal or family history of blood clots, recent travel immobilization, leg pain or swelling, hemoptysis, or exogenous hormones.  She denies abdominal pain vomiting or diarrhea.   History reviewed. No pertinent past medical history.  Patient Active Problem List   Diagnosis Date Noted  . 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 LITHOTRIPSY      Prior to Admission medications   Medication Sig Start Date End Date Taking? Authorizing Provider  buPROPion (WELLBUTRIN XL) 150 MG 24 hr tablet Take 150 mg by mouth daily. 03/28/18   [provider]  busPIRone (BUSPAR) 10 MG tablet TAKE 1 TABLET BY MOUTH TWICE A DAY AS NEEDED FOR ANXIETY 03/29/18   [provider]  fexofenadine (ALLEGRA) 180 MG tablet Take 180 mg by mouth daily.    [provider]  fluticasone (FLONASE) 50 MCG/ACT nasal spray Place 2 sprays into the nose daily.    [provider]  ibuprofen (ADVIL,MOTRIN) 200 MG  tablet Take 600 mg by mouth every 6 (six) hours as needed. For pain    [provider]  metoprolol succinate (TOPROL-XL) 25 MG 24 hr tablet Take 25 mg by mouth daily. 01/29/18   [provider]  norethindrone-ethinyl estradiol (JUNEL FE 1/20) 1-20 MG-MCG tablet Take 1 tablet by mouth daily. 04/11/18   Tresea Mall, CNM  valACYclovir (VALTREX) 500 MG tablet Take 1 tablet (500 mg total) by mouth daily. 04/11/18   Tresea Mall, CNM    Allergies Iodine and Shellfish allergy  Family History  Problem Relation Age of Onset  . Breast cancer Maternal Grandmother 60       x3/lumpectomy  . Colon cancer Maternal Grandfather 57    Social History Social History   Tobacco Use  . Smoking status: Never Smoker  . Smokeless tobacco: Never Used  Substance Use Topics  . Alcohol use: Yes    Comment: social  . Drug use: No    Review of Systems  Constitutional: + low grade fever, chills, body aches Eyes: Negative for visual changes. ENT: Negative for sore throat. Neck: No neck pain  Cardiovascular: + chest pain. Respiratory: + shortness of breath, cough Gastrointestinal: Negative for abdominal pain, vomiting or diarrhea. Genitourinary: Negative for dysuria. Musculoskeletal: Negative for back pain. Skin: Negative for rash. Neurological: Negative for headaches, weakness or numbness. Psych: No SI or HI  ____________________________________________   PHYSICAL EXAM:  VITAL SIGNS: ED Triage Vitals  Enc Vitals Group     BP 02/28/19 2001 139/76     Pulse Rate 02/28/19 1943 (!)  114     Resp 02/28/19 1943 10     Temp -- 98.5F     Temp src --      SpO2 02/28/19 1943 93 %     Weight 02/28/19 2016 140 lb (63.5 kg)     Height 02/28/19 2016 5\' 4"  (1.626 m)     Head Circumference --      Peak Flow --      Pain Score 02/28/19 2016 5     Pain Loc --      Pain Edu? --      Excl. in GC? --     Constitutional: Alert and oriented. Well appearing and in no apparent distress.  HEENT:      Head: Normocephalic and atraumatic.         Eyes: Conjunctivae are normal. Sclera is non-icteric.       Mouth/Throat: Mucous membranes are moist.       Neck: Supple with no signs of meningismus. Cardiovascular: Tachycardic with regular rhythm. No murmurs, gallops, or rubs. 2+ symmetrical distal pulses are present in all extremities. No JVD. Respiratory: Normal respiratory effort. Lungs are clear to auscultation bilaterally. No wheezes, crackles, or rhonchi.  Gastrointestinal: Soft, non tender, and non distended with positive bowel sounds. No rebound or guarding. Musculoskeletal: Nontender with normal range of motion in all extremities. No edema, cyanosis, or erythema of extremities. Neurologic: Normal speech and language. Face is symmetric. Moving all extremities. No gross focal neurologic deficits are appreciated. Skin: Skin is warm, dry and intact. No rash noted. Psychiatric: Mood and affect are normal. Speech and behavior are normal.  ____________________________________________   LABS (all labs ordered are listed, but only abnormal results are displayed)  Labs Reviewed  INFLUENZA PANEL BY PCR (TYPE A & B) - Abnormal; Notable for the following components:      Result Value   Influenza A By PCR POSITIVE (*)    All other components within normal limits  COMPREHENSIVE METABOLIC PANEL - Abnormal; Notable for the following components:   CO2 21 (*)    Glucose, Bld 100 (*)    All other components within normal limits  RESPIRATORY PANEL BY PCR  CBC WITH DIFFERENTIAL/PLATELET  TROPONIN I  HCG, QUANTITATIVE, PREGNANCY   ____________________________________________  EKG  ED ECG REPORT I, Nita Sickle, the attending physician, personally viewed and interpreted this ECG.  Sinus tachycardia, rate of 104, normal intervals, normal axis, no ST elevations or depressions.  Otherwise normal EKG. ____________________________________________  RADIOLOGY  I have personally  reviewed the images performed during this visit and I agree with the Radiologist's read.   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 EXAM: PORTABLE CHEST 1 VIEW COMPARISON:  10/03/2012 FINDINGS: The heart size and mediastinal contours are within normal limits. Both lungs are clear. No pleural effusion or pneumothorax. The visualized skeletal structures are unremarkable. IMPRESSION: No active disease. Electronically Signed   By: Amie Portland M.D.   On: 02/28/2019 20:42     ____________________________________________   PROCEDURES  Procedure(s) performed: None Procedures Critical Care performed:  None ____________________________________________   INITIAL IMPRESSION / ASSESSMENT AND PLAN / ED  COURSE  43 y.o. female with no significant past medical history who presents for evaluation of flulike symptoms x 1 week.  Patient is well-appearing, normal work of breathing, normal sats, afebrile, slightly tachycardic on exam.  Chest x-ray negative for infiltrate.  Flu swab and respiratory viral panel swab are pending.  Basic labs pending.  EKG with no acute ischemia.   Clinical Course as of Feb 28 2244  Thu Feb 28, 2019  2136 Patient is positive for influenza A.  Labs showing normal white count with normal differential, normal troponin, normal LFTs.  At this time low suspicion for coronavirus.  Discussed side effects and indications of Tamiflu.  Patient has opted for no Tamiflu.  Will give IV fluids and Toradol for body aches and for tachycardia.  Anticipate discharge home.   [CV]    Clinical Course User Index [CV] Don Perking, [place], MD    _________________________ 10:45 PM on 02/28/2019 -----------------------------------------  Patient remains extremely well-appearing, tolerating PO, heart rate improved after IV fluids.  Will discharge home with supportive care and follow-up with primary care doctor.  Discussed standard return precautions   As part of my medical decision making, I reviewed the following data within the electronic MEDICAL RECORD NUMBER Nursing notes reviewed and incorporated, Labs reviewed , EKG interpreted , Old chart reviewed, Radiograph reviewed , Notes from prior ED visits and [place] Controlled Substance Database    Pertinent labs & imaging results that were available during my care of the patient were reviewed by me and considered in my medical decision making (see chart for details).    ____________________________________________   FINAL CLINICAL IMPRESSION(S) / ED DIAGNOSES  Final diagnoses:  Influenza A      NEW MEDICATIONS STARTED DURING THIS VISIT:  ED Discharge Orders    None       Note:  This document was prepared using Dragon voice recognition software and may include unintentional dictation errors.    Don Perking, [place], MD 02/28/19 2245    Nita Sickle, MD 02/28/19 2245

## 2019-03-01 LAB — RESPIRATORY PANEL BY PCR
ADENOVIRUS-RVPPCR: NOT DETECTED
Bordetella pertussis: NOT DETECTED
Chlamydophila pneumoniae: NOT DETECTED
Coronavirus 229E: NOT DETECTED
Coronavirus HKU1: NOT DETECTED
Coronavirus NL63: NOT DETECTED
Coronavirus OC43: NOT DETECTED
Influenza A H1 2009: DETECTED — AB
Influenza B: NOT DETECTED
Metapneumovirus: NOT DETECTED
Mycoplasma pneumoniae: NOT DETECTED
Parainfluenza Virus 1: NOT DETECTED
Parainfluenza Virus 2: NOT DETECTED
Parainfluenza Virus 3: NOT DETECTED
Parainfluenza Virus 4: NOT DETECTED
Respiratory Syncytial Virus: NOT DETECTED
Rhinovirus / Enterovirus: NOT DETECTED

## 2019-03-31 ENCOUNTER — Other Ambulatory Visit: Payer: Self-pay | Admitting: Advanced Practice Midwife

## 2019-03-31 DIAGNOSIS — Z308 Encounter for other contraceptive management: Secondary | ICD-10-CM

## 2019-04-01 NOTE — Telephone Encounter (Signed)
Pt needs to call and schedule annual exam

## 2019-05-03 ENCOUNTER — Other Ambulatory Visit: Payer: Self-pay | Admitting: Advanced Practice Midwife

## 2019-05-03 DIAGNOSIS — B009 Herpesviral infection, unspecified: Secondary | ICD-10-CM

## 2019-05-17 DIAGNOSIS — F419 Anxiety disorder, unspecified: Secondary | ICD-10-CM | POA: Insufficient documentation

## 2019-05-17 DIAGNOSIS — I10 Essential (primary) hypertension: Secondary | ICD-10-CM | POA: Insufficient documentation

## 2019-05-17 DIAGNOSIS — J302 Other seasonal allergic rhinitis: Secondary | ICD-10-CM | POA: Insufficient documentation

## 2019-05-17 DIAGNOSIS — F329 Major depressive disorder, single episode, unspecified: Secondary | ICD-10-CM | POA: Insufficient documentation

## 2019-05-17 DIAGNOSIS — Z8619 Personal history of other infectious and parasitic diseases: Secondary | ICD-10-CM | POA: Insufficient documentation

## 2019-05-17 DIAGNOSIS — M5412 Radiculopathy, cervical region: Secondary | ICD-10-CM | POA: Insufficient documentation

## 2019-06-21 ENCOUNTER — Other Ambulatory Visit: Payer: Self-pay | Admitting: Advanced Practice Midwife

## 2019-06-21 DIAGNOSIS — Z308 Encounter for other contraceptive management: Secondary | ICD-10-CM

## 2019-06-24 ENCOUNTER — Other Ambulatory Visit: Payer: Self-pay

## 2019-06-24 ENCOUNTER — Telehealth: Payer: Self-pay

## 2019-06-24 DIAGNOSIS — Z308 Encounter for other contraceptive management: Secondary | ICD-10-CM

## 2019-06-24 MED ORDER — NORETHIN ACE-ETH ESTRAD-FE 1-20 MG-MCG PO TABS: 1.0000 | ORAL_TABLET | Freq: Every day | ORAL | 0 refills | Status: DC

## 2019-06-24 NOTE — Telephone Encounter (Signed)
Patient is schedule 07/26/19 with Opal Sidles. Patient requesting refill please

## 2019-06-24 NOTE — Telephone Encounter (Signed)
Pt calling for refill of bcp; hasn't come in d/t covid; will schedule appt if need be.  [phone]

## 2019-06-24 NOTE — Telephone Encounter (Signed)
Called and left voice mail for patient to call back to be schedule °

## 2019-06-24 NOTE — Telephone Encounter (Signed)
done

## 2019-07-26 ENCOUNTER — Other Ambulatory Visit: Payer: Self-pay

## 2019-07-26 ENCOUNTER — Ambulatory Visit (INDEPENDENT_AMBULATORY_CARE_PROVIDER_SITE_OTHER): Payer: BLUE CROSS/BLUE SHIELD | Admitting: Advanced Practice Midwife

## 2019-07-26 ENCOUNTER — Encounter: Payer: Self-pay | Admitting: Advanced Practice Midwife

## 2019-07-26 VITALS — BP 108/80 | Ht 62.0 in | Wt 143.2 lb

## 2019-07-26 DIAGNOSIS — Z01419 Encounter for gynecological examination (general) (routine) without abnormal findings: Secondary | ICD-10-CM

## 2019-07-26 DIAGNOSIS — G43829 Menstrual migraine, not intractable, without status migrainosus: Secondary | ICD-10-CM

## 2019-07-26 DIAGNOSIS — Z308 Encounter for other contraceptive management: Secondary | ICD-10-CM

## 2019-07-26 DIAGNOSIS — Z0000 Encounter for general adult medical examination without abnormal findings: Secondary | ICD-10-CM

## 2019-07-26 MED ORDER — NORETHIN ACE-ETH ESTRAD-FE 1-20 MG-MCG PO TABS: 1.0000 | ORAL_TABLET | Freq: Every day | ORAL | 4 refills | Status: DC

## 2019-07-26 MED ORDER — BUTALBITAL-ASPIRIN-CAFFEINE 50-325-40 MG PO CAPS: 2.0000 | ORAL_CAPSULE | Freq: Four times a day (QID) | ORAL | 5 refills | Status: DC | PRN
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
## 2019-07-26 NOTE — Patient Instructions (Signed)
Recurrent Migraine Headache    Migraines are a type of headache, and they are usually stronger and more sudden than normal headaches (tension headaches). Migraines are characterized by an intense pulsing, throbbing pain that is usually only present on one side of the head. Sometimes, migraine headaches can cause nausea, vomiting, sensitivity to light and sound, and vision changes. Recurrent migraines keep coming back (recurring). A migraine can last from 4 hours up to 3 days.  What are the causes?  The exact cause of this condition is not known. However, a migraine may be caused when nerves in the brain become irritated and release chemicals that cause inflammation of blood vessels. This inflammation causes pain.  Certain things may also trigger migraines, such as:  · A disruption in your regular eating and sleeping schedule.  · Smoking.  · Stress.  · Menstruation.  · Certain foods and drinks, such as:  ? Aged cheese.  ? Chocolate.  ? Alcohol.  ? Caffeine.  ? Foods or drinks that contain nitrates, glutamate, aspartame, MSG, or tyramine.  · Lack of sleep.  · Hunger.  · Physical exertion.  · Fatigue.  · High altitude.  · Weather changes.  · Medicines, such as:  ? Nitroglycerin, which is used to treat chest pain.  ? Birth control pills.  ? Estrogen.  ? Some blood pressure medicines.  What are the signs or symptoms?  Symptoms of this condition vary for each person and may include:  · Pain that is usually only present on one side of the head. In some cases, the pain may be on both sides of the head or around the head or neck.  · Pulsating or throbbing pain.  · Severe pain that prevents daily activities.  · Pain that is aggravated by any physical activity.  · Nausea, vomiting, or both.  · Dizziness.  · Pain with exposure to bright lights, loud noises, or activity.  · General sensitivity to bright lights, loud noises, or smells.  Before you get a migraine, you may get warning signs that a migraine is coming (aura). An aura  may include:  · Seeing flashing lights.  · Seeing bright spots, halos, or zigzag lines.  · Having tunnel vision or blurred vision.  · Having numbness or a tingling feeling.  · Having trouble talking.  · Having muscle weakness.  · Smelling a certain odor.  How is this diagnosed?  This condition is often diagnosed based on:  · Your symptoms and medical history.  · A physical exam.  You may also have tests, including:  · A CT scan or MRI of your brain. These imaging tests cannot diagnose migraines, but they can help to rule out other causes of headaches.  · Blood tests.  How is this treated?  This condition is treated with:  · Medicines. These are used for:  ? Lessening pain and nausea.  ? Preventing recurrent migraines.  · Lifestyle changes, such as changes to your diet or sleeping patterns.  · Behavior therapy, such as relaxation training or biofeedback. Biofeedback is a treatment that involves teaching you to relax and use your brain to lower your heart rate and control your breathing.  Follow these instructions at home:  Medicines  · Take over-the-counter and prescription medicines only as told by your health care provider.  · Do not drive or use heavy machinery while taking prescription pain medicine.  Lifestyle  · Do not use any products that contain nicotine or tobacco, such as   sleep recommended by your health care provider.  Limit your stress. Talk with your health care provider if you need help with stress management.  Maintain a healthy weight. If you need help losing weight, ask your health care provider.  Exercise regularly. Aim for 150 minutes of moderate-intensity exercise (walking,  biking, yoga) or 75 minutes of vigorous exercise (running, circuit training, swimming) each week. General instructions   Keep a journal to find out what triggers your migraine headaches so you can avoid these triggers. For example, write down: ? What you eat and drink. ? How much sleep you get. ? Any change to your diet or medicines.  Lie down in a dark, quiet room when you have a migraine.  Try placing a cool towel over your head when you have a migraine.  Keep lights dim, if bright lights bother you and make your migraines worse.  Keep all follow-up visits as told by your health care provider. This is important. Contact a health care provider if:  Your pain does not improve, even with medicine.  Your migraines continue to return, even with medicine.  You have a fever.  You have weight loss. Get help right away if:  Your migraine becomes severe and medicine does not help.  You have a stiff neck.  You have a loss of vision.  You have muscle weakness or loss of muscle control.  You start losing your balance or have trouble walking.  You feel faint or you pass out.  You develop new, severe symptoms.  You start having abrupt severe headaches that last for a second or less, like a thunderclap. Summary  Migraine headaches are usually stronger and more sudden than normal headaches (tension headaches). Migraines are characterized by an intense pulsing, throbbing pain that is usually only present on one side of the head.  The exact cause of this condition is not known. However, a migraine may be caused when nerves in the brain become irritated and release chemicals that cause inflammation of blood vessels.  Certain things may trigger migraines, such as changes to diet or sleeping patterns, smoking, certain foods, alcohol, stress, and certain medicines.  Sometimes, migraine headaches can cause nausea, vomiting, sensitivity to light and sound, and vision changes.  Migraines  are often diagnosed based on your symptoms, medical history, and a physical exam. This information is not intended to replace advice given to you by your health care provider. Make sure you discuss any questions you have with your health care provider. Document Released: 08/30/2001 Document Revised: 12/08/2017 Document Reviewed: 09/16/2016 Elsevier Patient Education  2020 [employer] Health Maintenance, Female Adopting a healthy lifestyle and getting preventive care are important in promoting health and wellness. Ask your health care provider about:  The right schedule for you to have regular tests and exams.  Things you can do on your own to prevent diseases and keep yourself healthy. What should I know about diet, weight, and exercise? Eat a healthy diet   Eat a diet that includes plenty of vegetables, fruits, low-fat dairy products, and lean protein.  Do not eat a lot of foods that are high in solid fats, added sugars, or sodium. Maintain a healthy weight Body mass index (BMI) is used to identify weight problems. It estimates body fat based on height and weight. Your health care provider can help determine your BMI and help you achieve or maintain a healthy weight. Get regular exercise Get regular exercise. This is one of the most important things  you can do for your health. Most adults should:  Exercise for at least 150 minutes each week. The exercise should increase your heart rate and make you sweat (moderate-intensity exercise).  Do strengthening exercises at least twice a week. This is in addition to the moderate-intensity exercise.  Spend less time sitting. Even light physical activity can be beneficial. Watch cholesterol and blood lipids Have your blood tested for lipids and cholesterol at 42 years of age, then have this test every 5 years. Have your cholesterol levels checked more often if:  Your lipid or cholesterol levels are high.  You are older than 42 years of age.   You are at high risk for heart disease. What should I know about cancer screening? Depending on your health history and family history, you may need to have cancer screening at various ages. This may include screening for:  Breast cancer.  Cervical cancer.  Colorectal cancer.  Skin cancer.  Lung cancer. What should I know about heart disease, diabetes, and high blood pressure? Blood pressure and heart disease  High blood pressure causes heart disease and increases the risk of stroke. This is more likely to develop in people who have high blood pressure readings, are of African descent, or are overweight.  Have your blood pressure checked: ? Every 3-5 years if you are 5018-42 years of age. ? Every year if you are 42 years old or older. Diabetes Have regular diabetes screenings. This checks your fasting blood sugar level. Have the screening done:  Once every three years after age 940 if you are at a normal weight and have a low risk for diabetes.  More often and at a younger age if you are overweight or have a high risk for diabetes. What should I know about preventing infection? Hepatitis B If you have a higher risk for hepatitis B, you should be screened for this virus. Talk with your health care provider to find out if you are at risk for hepatitis B infection. Hepatitis C Testing is recommended for:  Everyone born from [id number] through 1964.  Anyone with known risk factors for hepatitis C. Sexually transmitted infections (STIs)  Get screened for STIs, including gonorrhea and chlamydia, if: ? You are sexually active and are younger than 42 years of age. ? You are older than 42 years of age and your health care provider tells you that you are at risk for this type of infection. ? Your sexual activity has changed since you were last screened, and you are at increased risk for chlamydia or gonorrhea. Ask your health care provider if you are at risk.  Ask your health care provider  about whether you are at high risk for HIV. Your health care provider may recommend a prescription medicine to help prevent HIV infection. If you choose to take medicine to prevent HIV, you should first get tested for HIV. You should then be tested every 3 months for as long as you are taking the medicine. Pregnancy  If you are about to stop having your period (premenopausal) and you may become pregnant, seek counseling before you get pregnant.  Take 400 to 800 micrograms (mcg) of folic acid every day if you become pregnant.  Ask for birth control (contraception) if you want to prevent pregnancy. Osteoporosis and menopause Osteoporosis is a disease in which the bones lose minerals and strength with aging. This can result in bone fractures. If you are 42 years old or older, or if you are at  risk for osteoporosis and fractures, ask your health care provider if you should:  Be screened for bone loss.  Take a calcium or vitamin D supplement to lower your risk of fractures.  Be given hormone replacement therapy (HRT) to treat symptoms of menopause. Follow these instructions at home: Lifestyle  Do not use any products that contain nicotine or tobacco, such as cigarettes, e-cigarettes, and chewing tobacco. If you need help quitting, ask your health care provider.  Do not use street drugs.  Do not share needles.  Ask your health care provider for help if you need support or information about quitting drugs. Alcohol use  Do not drink alcohol if: ? Your health care provider tells you not to drink. ? You are pregnant, may be pregnant, or are planning to become pregnant.  If you drink alcohol: ? Limit how much you use to 0-1 drink a day. ? Limit intake if you are breastfeeding.  Be aware of how much alcohol is in your drink. In the [place] one drink equals one 12 oz bottle of beer (355 mL), one 5 oz glass of wine (148 mL), or one 1 oz glass of hard liquor (44 mL). General instructions   Schedule regular health, dental, and eye exams.  Stay current with your vaccines.  Tell your health care provider if: ? You often feel depressed. ? You have ever been abused or do not feel safe at home. Summary  Adopting a healthy lifestyle and getting preventive care are important in promoting health and wellness.  Follow your health care provider's instructions about healthy diet, exercising, and getting tested or screened for diseases.  Follow your health care provider's instructions on monitoring your cholesterol and blood pressure. This information is not intended to replace advice given to you by your health care provider. Make sure you discuss any questions you have with your health care provider. Document Released: 06/20/2011 Document Revised: 11/28/2018 Document Reviewed: 11/28/2018 Elsevier Patient Education  2020 [employer].

## 2019-08-28 ENCOUNTER — Encounter: Payer: Self-pay | Admitting: Obstetrics and Gynecology

## 2020-06-26 DIAGNOSIS — G43709 Chronic migraine without aura, not intractable, without status migrainosus: Secondary | ICD-10-CM | POA: Insufficient documentation

## 2020-07-24 ENCOUNTER — Other Ambulatory Visit: Payer: Self-pay | Admitting: Advanced Practice Midwife

## 2020-07-24 DIAGNOSIS — B009 Herpesviral infection, unspecified: Secondary | ICD-10-CM

## 2020-07-24 NOTE — Telephone Encounter (Signed)
Please advise 

## 2020-07-30 ENCOUNTER — Other Ambulatory Visit: Payer: Self-pay | Admitting: Advanced Practice Midwife

## 2020-07-30 DIAGNOSIS — Z308 Encounter for other contraceptive management: Secondary | ICD-10-CM

## 2020-08-04 ENCOUNTER — Other Ambulatory Visit: Payer: Self-pay

## 2020-08-04 DIAGNOSIS — Z308 Encounter for other contraceptive management: Secondary | ICD-10-CM

## 2020-08-04 MED ORDER — NORETHIN ACE-ETH ESTRAD-FE 1-20 MG-MCG PO TABS: 1.0000 | ORAL_TABLET | Freq: Every day | ORAL | 0 refills | Status: DC

## 2020-08-18 ENCOUNTER — Telehealth: Payer: Self-pay

## 2020-08-18 NOTE — Telephone Encounter (Signed)
Opened in error

## 2020-09-04 ENCOUNTER — Ambulatory Visit (INDEPENDENT_AMBULATORY_CARE_PROVIDER_SITE_OTHER): Payer: BC Managed Care – PPO | Admitting: Advanced Practice Midwife

## 2020-09-04 ENCOUNTER — Other Ambulatory Visit: Payer: Self-pay

## 2020-09-04 ENCOUNTER — Encounter: Payer: Self-pay | Admitting: Advanced Practice Midwife

## 2020-09-04 VITALS — BP 120/80 | Ht 62.0 in | Wt 140.0 lb

## 2020-09-04 DIAGNOSIS — Z308 Encounter for other contraceptive management: Secondary | ICD-10-CM

## 2020-09-04 DIAGNOSIS — Z0000 Encounter for general adult medical examination without abnormal findings: Secondary | ICD-10-CM

## 2020-09-04 DIAGNOSIS — R239 Unspecified skin changes: Secondary | ICD-10-CM | POA: Insufficient documentation

## 2020-09-04 DIAGNOSIS — B009 Herpesviral infection, unspecified: Secondary | ICD-10-CM

## 2020-09-04 DIAGNOSIS — Z1239 Encounter for other screening for malignant neoplasm of breast: Secondary | ICD-10-CM

## 2020-09-04 MED ORDER — VALACYCLOVIR HCL 500 MG PO TABS: 500.0000 mg | ORAL_TABLET | Freq: Every day | ORAL | 11 refills | Status: DC

## 2020-09-04 MED ORDER — NORETHIN ACE-ETH ESTRAD-FE 1-20 MG-MCG PO TABS: 1.0000 | ORAL_TABLET | Freq: Every day | ORAL | 4 refills | Status: DC

## 2020-09-04 NOTE — Progress Notes (Signed)
Gynecology Annual Exam  PCP: Patient, No Pcp Per  Chief Complaint:  Chief Complaint  Patient presents with  . Gynecologic Exam    History of Present Illness: Patient is a 44 y.o. G2P2002 presents for annual exam. The patient has no complaints today. She admits some mild hot flashes. She also has some anxiety and depression and has had her Buspar dose adjusted recently which is helping. Her migraines have greatly improved under the care of [facility].  LMP: Patient's last menstrual period was 08/21/2020. Average Interval: regular, 28 days Duration of flow: 3-4 days Heavy Menses: no Clots: no Intermenstrual Bleeding: no Postcoital Bleeding: no Dysmenorrhea: no   The patient is sexually active. She currently uses OCP (estrogen/progesterone) for contraception. She denies dyspareunia.  The patient does perform self breast exams.  There is no notable family history of breast or ovarian cancer in her family.  The patient wears seatbelts: yes.   The patient has regular exercise: she works out daily strength and cardio. She admits healthy lifestyle diet, hydration and sleep.     Review of Systems: Review of Systems  Constitutional: Negative for chills and fever.  HENT: Negative for congestion, ear discharge, ear pain, hearing loss, sinus pain and sore throat.   Eyes: Negative for blurred vision and double vision.  Respiratory: Negative for cough, shortness of breath and wheezing.   Cardiovascular: Negative for chest pain, palpitations and leg swelling.  Gastrointestinal: Negative for abdominal pain, blood in stool, constipation, diarrhea, heartburn, melena, nausea and vomiting.  Genitourinary: Negative for dysuria, flank pain, frequency, hematuria and urgency.  Musculoskeletal: Negative for back pain, joint pain and myalgias.  Skin: Negative for itching and rash.  Neurological: Negative for dizziness, tingling, tremors, sensory change, speech change, focal weakness, seizures,  loss of consciousness, weakness and headaches.  Endo/Heme/Allergies: Negative for environmental allergies. Does not bruise/bleed easily.       Positive for hot flashes  Psychiatric/Behavioral: Negative for depression, hallucinations, memory loss, substance abuse and suicidal ideas. The patient is not nervous/anxious and does not have insomnia.        Positive for anxiety and depression    Past Medical History:  Patient Active Problem List   Diagnosis Date Noted  . Unspecified skin changes 09/04/2020  . Chronic migraine without aura without status migrainosus, not intractable 06/26/2020  . Anxiety and depression 05/17/2019  . Cervical radiculitis 05/17/2019  . Essential hypertension 05/17/2019  . History of herpes genitalis 05/17/2019  . Seasonal allergies 05/17/2019  . Sleep disorder 04/24/2014    Past Surgical History:  Past Surgical History:  Procedure Laterality Date  . LITHOTRIPSY      Gynecologic History:  Patient's last menstrual period was 08/21/2020. Contraception: OCP (estrogen/progesterone) Last Pap: 2 years ago Results were:  no abnormalities  Last mammogram: 2 years ago Results were: BI-RAD II Benign  Obstetric History: [id number]  Family History:  Family History  Problem Relation Age of Onset  . Breast cancer Maternal Grandmother 60       x3/lumpectomy  . Colon cancer Maternal Grandfather 29    Social History:  Social History   Socioeconomic History  . Marital status: Married    Spouse name: Not on file  . Number of children: Not on file  . Years of education: Not on file  . Highest education level: Not on file  Occupational History  . Not on file  Tobacco Use  . Smoking status: Never Smoker  . Smokeless tobacco: Never Used  Vaping Use  .  Vaping Use: Never used  Substance and Sexual Activity  . Alcohol use: Yes    Comment: social  . Drug use: No  . Sexual activity: Yes    Birth control/protection: Pill  Other Topics Concern  . Not on file    Social History Narrative  . Not on file   Social Determinants of Health   Financial Resource Strain:   . Difficulty of Paying Living Expenses: Not on file  Food Insecurity:   . Worried About Programme researcher, broadcasting/film/video in the Last Year: Not on file  . Ran Out of Food in the Last Year: Not on file  Transportation Needs:   . Lack of Transportation (Medical): Not on file  . Lack of Transportation (Non-Medical): Not on file  Physical Activity:   . Days of Exercise per Week: Not on file  . Minutes of Exercise per Session: Not on file  Stress:   . Feeling of Stress : Not on file  Social Connections:   . Frequency of Communication with Friends and Family: Not on file  . Frequency of Social Gatherings with Friends and Family: Not on file  . Attends Religious Services: Not on file  . Active Member of Clubs or Organizations: Not on file  . Attends Banker Meetings: Not on file  . Marital Status: Not on file  Intimate Partner Violence:   . Fear of Current or Ex-Partner: Not on file  . Emotionally Abused: Not on file  . Physically Abused: Not on file  . Sexually Abused: Not on file    Allergies:  Allergies  Allergen Reactions  . Iodine Anaphylaxis, Hives and Rash  . Shellfish Allergy Anaphylaxis, Hives and Rash    Medications: Prior to Admission medications   Medication Sig Start Date End Date Taking? Authorizing Provider  buPROPion (WELLBUTRIN XL) 150 MG 24 hr tablet Take 150 mg by mouth daily. 03/28/18  Yes [provider]  busPIRone (BUSPAR) 10 MG tablet TAKE 1 TABLET BY MOUTH TWICE A DAY AS NEEDED FOR ANXIETY 03/29/18  Yes [provider]  butalbital-aspirin-caffeine (FIORINAL) 50-325-40 MG capsule Take 2 capsules by mouth every 6 (six) hours as needed for headache. Up to 4 doses 07/26/19  Yes Tresea Mall, CNM  cyclobenzaprine (FLEXERIL) 10 MG tablet One tablet three times daily as needed for headache pain 09/04/19  Yes [provider]   fexofenadine (ALLEGRA) 180 MG tablet Take 180 mg by mouth daily.   Yes [provider]  fluticasone (FLONASE) 50 MCG/ACT nasal spray Place 2 sprays into the nose daily.   Yes [provider]  Galcanezumab-gnlm (EMGALITY) 120 MG/ML SOAJ INJECT 1 ML INTO THE SKIN EVERY 30 (THIRTY) DAYS. 11/28/19  Yes [provider]  ibuprofen (ADVIL,MOTRIN) 200 MG tablet Take 600 mg by mouth every 6 (six) hours as needed. For pain   Yes [provider]  lisinopril (ZESTRIL) 20 MG tablet Take by mouth. 05/20/19  Yes [provider]  norethindrone-ethinyl estradiol (JUNEL FE 1/20) 1-20 MG-MCG tablet Take 1 tablet by mouth daily. 08/04/20  Yes Tresea Mall, CNM  prochlorperazine (COMPAZINE) 10 MG tablet TAKE ONE TABLET (10 MG DOSE) BY MOUTH EVERY 8 (EIGHT) HOURS AS NEEDED. 09/04/19  Yes [provider]  Rimegepant Sulfate (NURTEC) 75 MG TBDP Take 1 tablet by mouth daily as needed. 09/04/19  Yes [provider]  Ubrogepant 100 MG TABS Take by mouth. 08/31/20  Yes [provider]  valACYclovir (VALTREX) 500 MG tablet TAKE 1 TABLET BY  MOUTH EVERY DAY 07/24/20  Yes Tresea Mall, CNM    Physical Exam Vitals: Blood pressure 120/80, height 5\' 2"  (1.575 m), weight 140 lb (63.5 kg), last menstrual period 08/21/2020.  General: NAD HEENT: normocephalic, anicteric Thyroid: no enlargement, no palpable nodules Pulmonary: No increased work of breathing, CTAB Cardiovascular: RRR, distal pulses 2+ Breast: Breast symmetrical, no tenderness, no palpable nodules or masses, no skin or nipple retraction present, no nipple discharge.  No axillary or supraclavicular lymphadenopathy. Abdomen: NABS, soft, non-tender, non-distended.  Umbilicus without lesions.  No hepatomegaly, splenomegaly or masses palpable. No evidence of hernia  Genitourinary: deferred for no concerns/PAP interval Extremities: no edema, erythema, or tenderness Neurologic: Grossly intact Psychiatric:  mood appropriate, affect full    Assessment: 44 y.o. G2P2002 routine annual exam  Plan: Problem List Items Addressed This Visit    None    Visit Diagnoses    Well woman exam without gynecological exam    -  Primary   Relevant Orders   MM DIGITAL SCREENING BILATERAL   Encounter for screening for malignant neoplasm of breast, unspecified screening modality       Relevant Orders   MM DIGITAL SCREENING BILATERAL   Herpes simplex       Relevant Medications   valACYclovir (VALTREX) 500 MG tablet   Encounter for other contraceptive management       Relevant Medications   norethindrone-ethinyl estradiol (JUNEL FE 1/20) 1-20 MG-MCG tablet      1) Mammogram - recommend yearly screening mammogram.  Mammogram Was ordered today   2) STI screening  was offered and declined  3) ASCCP guidelines and rationale discussed.  Patient opts for every 3 years screening interval  4) Contraception - the patient is currently using  OCP (estrogen/progesterone).  She is happy with her current form of contraception and plans to continue  5) Colonoscopy -- Screening recommended starting at age 60 for average risk individuals, age 66 for individuals deemed at increased risk (including African Americans) and recommended to continue until age 70.  For patient age 40-85 individualized approach is recommended.  Gold standard screening is via colonoscopy, Cologuard screening is an acceptable alternative for patient unwilling or unable to undergo colonoscopy.  "Colorectal cancer screening for average?risk adults: 2018 guideline update from the American Cancer Society"CA: A Cancer Journal for Clinicians: May 17, 2017   6) Routine healthcare maintenance including cholesterol, diabetes screening discussed Declines  7) Return in about 1 year (around 09/04/2021) for annual established gyn.   09/06/2021, CNM [facility] 09/04/2020, 5:12 PM

## 2020-09-25 ENCOUNTER — Ambulatory Visit
Admission: RE | Admit: 2020-09-25 | Discharge: 2020-09-25 | Disposition: A | Discharge status: Home / Self Care | Payer: BC Managed Care – PPO | Source: Ambulatory Visit | Attending: Advanced Practice Midwife | Admitting: Advanced Practice Midwife

## 2020-09-25 ENCOUNTER — Other Ambulatory Visit: Payer: Self-pay

## 2020-09-25 DIAGNOSIS — Z0000 Encounter for general adult medical examination without abnormal findings: Secondary | ICD-10-CM | POA: Diagnosis not present

## 2020-09-25 DIAGNOSIS — Z1231 Encounter for screening mammogram for malignant neoplasm of breast: Secondary | ICD-10-CM | POA: Diagnosis not present

## 2020-09-25 DIAGNOSIS — Z1239 Encounter for other screening for malignant neoplasm of breast: Secondary | ICD-10-CM

## 2020-09-30 ENCOUNTER — Other Ambulatory Visit: Payer: Self-pay | Admitting: *Deleted

## 2020-09-30 ENCOUNTER — Inpatient Hospital Stay
Admission: RE | Admit: 2020-09-30 | Discharge: 2020-09-30 | Disposition: A | Discharge status: Home / Self Care | Payer: Self-pay | Source: Ambulatory Visit | Attending: *Deleted | Admitting: *Deleted

## 2020-09-30 ENCOUNTER — Other Ambulatory Visit: Payer: Self-pay | Admitting: Advanced Practice Midwife

## 2020-09-30 DIAGNOSIS — Z1231 Encounter for screening mammogram for malignant neoplasm of breast: Secondary | ICD-10-CM

## 2021-09-17 ENCOUNTER — Other Ambulatory Visit (HOSPITAL_COMMUNITY)
Admission: RE | Admit: 2021-09-17 | Discharge: 2021-09-17 | Disposition: A | Discharge status: Home / Self Care | Payer: BC Managed Care – PPO | Source: Ambulatory Visit | Attending: Advanced Practice Midwife | Admitting: Advanced Practice Midwife

## 2021-09-17 ENCOUNTER — Other Ambulatory Visit: Payer: Self-pay

## 2021-09-17 ENCOUNTER — Encounter: Payer: Self-pay | Admitting: Advanced Practice Midwife

## 2021-09-17 ENCOUNTER — Ambulatory Visit (INDEPENDENT_AMBULATORY_CARE_PROVIDER_SITE_OTHER): Payer: BC Managed Care – PPO | Admitting: Advanced Practice Midwife

## 2021-09-17 VITALS — BP 126/82 | HR 84 | Ht 62.0 in | Wt 159.0 lb

## 2021-09-17 DIAGNOSIS — Z01419 Encounter for gynecological examination (general) (routine) without abnormal findings: Secondary | ICD-10-CM | POA: Diagnosis not present

## 2021-09-17 DIAGNOSIS — Z124 Encounter for screening for malignant neoplasm of cervix: Secondary | ICD-10-CM | POA: Diagnosis not present

## 2021-09-17 DIAGNOSIS — Z1239 Encounter for other screening for malignant neoplasm of breast: Secondary | ICD-10-CM

## 2021-09-17 DIAGNOSIS — Z308 Encounter for other contraceptive management: Secondary | ICD-10-CM

## 2021-09-17 MED ORDER — NORETHIN ACE-ETH ESTRAD-FE 1-20 MG-MCG PO TABS: 1.0000 | ORAL_TABLET | Freq: Every day | ORAL | 4 refills | Status: DC

## 2021-09-17 NOTE — Progress Notes (Signed)
Gynecology Annual Exam  PCP: Patient, No Pcp Per (Inactive)  Chief Complaint:  Chief Complaint  Patient presents with   Gynecologic Exam    Annual - no concerns. RM 5    History of Present Illness: Patient is a 45 y.o. [id number] presents for annual exam. The patient has no complaints gyn today. Her main concern is weight gain in the past year- especially abdominal.   LMP: Patient's last menstrual period was 09/13/2021. Average Interval: regular, 28 days Duration of flow:  3-4  days Heavy Menses: no Clots: no Intermenstrual Bleeding: no Postcoital Bleeding: no Dysmenorrhea: no   The patient is sexually active. She currently uses OCP (estrogen/progesterone) for contraception. She denies dyspareunia.  The patient does perform self breast exams.  There is no notable family history of breast or ovarian cancer in her family.  The patient wears seatbelts: yes.   The patient has regular exercise: she does 30-45 minutes most days of cardio, weights, she admits good diet generally and may be eating too many potatoes, she admits good hydration and adequate sleep.    The patient denies current symptoms of depression.  Current medications are controlling.  Review of Systems: Review of Systems  Constitutional:  Positive for malaise/fatigue. Negative for chills and fever.  HENT:  Negative for congestion, ear discharge, ear pain, hearing loss, sinus pain and sore throat.   Eyes:  Negative for blurred vision and double vision.       Positive for change in vision  Respiratory:  Negative for cough, shortness of breath and wheezing.   Cardiovascular:  Negative for chest pain, palpitations and leg swelling.  Gastrointestinal:  Positive for constipation. Negative for abdominal pain, blood in stool, diarrhea, heartburn, melena, nausea and vomiting.  Genitourinary:  Negative for dysuria, flank pain, frequency, hematuria and urgency.  Musculoskeletal:  Negative for back pain, joint pain and myalgias.   Skin:  Negative for itching and rash.  Neurological:  Negative for dizziness, tingling, tremors, sensory change, speech change, focal weakness, seizures, loss of consciousness, weakness and headaches.  Endo/Heme/Allergies:  Negative for environmental allergies. Does not bruise/bleed easily.       Positive for hot flashes, weight gain  Psychiatric/Behavioral:  Negative for depression, hallucinations, memory loss, substance abuse and suicidal ideas. The patient is not nervous/anxious and does not have insomnia.    Past Medical History:  Patient Active Problem List   Diagnosis Date Noted   Unspecified skin changes 09/04/2020   Chronic migraine without aura without status migrainosus, not intractable 06/26/2020   Anxiety and depression 05/17/2019   Cervical radiculitis 05/17/2019   Essential hypertension 05/17/2019   History of herpes genitalis 05/17/2019   Seasonal allergies 05/17/2019   Sleep disorder 04/24/2014    Past Surgical History:  Past Surgical History:  Procedure Laterality Date   LITHOTRIPSY      Gynecologic History:  Patient's last menstrual period was 09/13/2021. Contraception: OCP (estrogen/progesterone) Last Pap: 3 years ago Results were:  no abnormalities  Last mammogram: 1 year ago Results were: BI-RAD I  Obstetric History: [id number]  Family History:  Family History  Problem Relation Age of Onset   Breast cancer Maternal Grandmother 55       x3/lumpectomy   Colon cancer Maternal Grandfather 6    Social History:  Social History   Socioeconomic History   Marital status: Married    Spouse name: Not on file   Number of children: Not on file   Years of education: Not on file  Highest education level: Not on file  Occupational History   Not on file  Tobacco Use   Smoking status: Never   Smokeless tobacco: Never  Vaping Use   Vaping Use: Never used  Substance and Sexual Activity   Alcohol use: Yes    Comment: social   Drug use: No   Sexual  activity: Yes    Birth control/protection: Pill  Other Topics Concern   Not on file  Social History Narrative   Not on file   Social Determinants of Health   Financial Resource Strain: Not on file  Food Insecurity: Not on file  Transportation Needs: Not on file  Physical Activity: Not on file  Stress: Not on file  Social Connections: Not on file  Intimate Partner Violence: Not on file    Allergies:  Allergies  Allergen Reactions   Iodine Anaphylaxis, Hives and Rash   Shellfish Allergy Anaphylaxis, Hives and Rash    Medications: Prior to Admission medications   Medication Sig Start Date End Date Taking? Authorizing Provider  busPIRone (BUSPAR) 10 MG tablet TAKE 1 TABLET BY MOUTH TWICE A DAY AS NEEDED FOR ANXIETY 03/29/18  Yes [provider]  cyclobenzaprine (FLEXERIL) 10 MG tablet One tablet three times daily as needed for headache pain 09/04/19  Yes [provider]  escitalopram (LEXAPRO) 5 MG tablet Take 1 tablet by mouth daily. 06/24/21  Yes [provider]  fexofenadine (ALLEGRA) 180 MG tablet Take 180 mg by mouth daily.   Yes [provider]  fluticasone (FLONASE) 50 MCG/ACT nasal spray Place 2 sprays into the nose daily.   Yes [provider]  Galcanezumab-gnlm (EMGALITY) 120 MG/ML SOAJ INJECT 1 ML INTO THE SKIN EVERY 30 (THIRTY) DAYS. 11/28/19  Yes [provider]  lisinopril (ZESTRIL) 20 MG tablet Take by mouth. 05/20/19  Yes [provider]  prochlorperazine (COMPAZINE) 10 MG tablet TAKE ONE TABLET (10 MG DOSE) BY MOUTH EVERY 8 (EIGHT) HOURS AS NEEDED. 09/04/19  Yes [provider]  ibuprofen (ADVIL,MOTRIN) 200 MG tablet Take 600 mg by mouth every 6 (six) hours as needed. For pain    [provider]  norethindrone-ethinyl estradiol-FE (JUNEL FE 1/20) 1-20 MG-MCG tablet Take 1 tablet by mouth daily. 09/17/21   Tresea Mall, CNM  valACYclovir (VALTREX) 500 MG tablet Take 1 tablet (500 mg total) by  mouth daily. 09/04/20   Tresea Mall, CNM    Physical Exam Vitals: Blood pressure 126/82, pulse 84, height 5\' 2"  (1.575 m), weight 159 lb (72.1 kg), last menstrual period 09/13/2021.  General: NAD HEENT: normocephalic, anicteric Thyroid: no enlargement, no palpable nodules Pulmonary: No increased work of breathing, CTAB Cardiovascular: RRR, distal pulses 2+ Breast: Breast symmetrical, no tenderness, no palpable nodules or masses, no skin or nipple retraction present, no nipple discharge.  No axillary or supraclavicular lymphadenopathy. Abdomen: NABS, soft, non-tender, non-distended.  Umbilicus without lesions.  No hepatomegaly, splenomegaly or masses palpable. No evidence of hernia  Genitourinary:  External: Normal external female genitalia.  Normal urethral meatus, normal Bartholin's and Skene's glands.    Vagina: Normal vaginal mucosa, no evidence of prolapse.    Cervix: Grossly normal in appearance, no bleeding  Uterus: Non-enlarged, mobile, normal contour.  No CMT  Adnexa: ovaries non-enlarged, no adnexal masses  Rectal: deferred  Lymphatic: no evidence of inguinal lymphadenopathy Extremities: no edema, erythema, or tenderness Neurologic: Grossly intact Psychiatric: mood appropriate, affect full  Female chaperone present for pelvic and breast  portions of the physical exam  Assessment: 45 y.o. G2P2002 routine annual exam  Plan: Problem List Items Addressed This Visit   None Visit Diagnoses     Well woman exam with routine gynecological exam    -  Primary   Relevant Orders   MM 3D SCREEN BREAST BILATERAL   Cytology - PAP   Encounter for other contraceptive management       Relevant Medications   norethindrone-ethinyl estradiol-FE (JUNEL FE 1/20) 1-20 MG-MCG tablet   Breast screening       Relevant Orders   MM 3D SCREEN BREAST BILATERAL   Screening for cervical cancer       Relevant Orders   Cytology - PAP       1) Mammogram - recommend yearly screening  mammogram.  Mammogram Was ordered today  2) STI screening  wasoffered and declined  3) ASCCP guidelines and rationale discussed.  Patient opts for every 3 years screening interval  4) Contraception - the patient is currently using  OCP (estrogen/progesterone).  She is happy with her current form of contraception and plans to continue  5) Colonoscopy -- Screening recommended starting at age 53 for average risk individuals, age 65 for individuals deemed at increased risk (including African Americans) and recommended to continue until age 55.  For patient age 17-85 individualized approach is recommended.  Gold standard screening is via colonoscopy, Cologuard screening is an acceptable alternative for patient unwilling or unable to undergo colonoscopy.  "Colorectal cancer screening for average?risk adults: 2018 guideline update from the American Cancer Society"[place]: A Cancer Journal for Clinicians: May 17, 2017   6) Routine healthcare maintenance including cholesterol, diabetes screening discussed managed by PCP  7) Return in about 1 year (around 09/17/2022) for annual established gyn.   Tresea Mall, CNM [facility] 09/17/2021, 4:32 PM

## 2021-09-21 LAB — CYTOLOGY - PAP
Comment: NEGATIVE
Diagnosis: NEGATIVE
High risk HPV: NEGATIVE

## 2021-10-13 ENCOUNTER — Other Ambulatory Visit: Payer: Self-pay | Admitting: Advanced Practice Midwife

## 2021-10-13 DIAGNOSIS — B009 Herpesviral infection, unspecified: Secondary | ICD-10-CM

## 2021-10-22 ENCOUNTER — Other Ambulatory Visit: Payer: Self-pay

## 2021-10-22 ENCOUNTER — Ambulatory Visit
Admission: RE | Admit: 2021-10-22 | Discharge: 2021-10-22 | Disposition: A | Discharge status: Home / Self Care | Payer: BC Managed Care – PPO | Source: Ambulatory Visit | Attending: Advanced Practice Midwife | Admitting: Advanced Practice Midwife

## 2021-10-22 DIAGNOSIS — Z1231 Encounter for screening mammogram for malignant neoplasm of breast: Secondary | ICD-10-CM | POA: Insufficient documentation

## 2021-10-22 DIAGNOSIS — Z01419 Encounter for gynecological examination (general) (routine) without abnormal findings: Secondary | ICD-10-CM | POA: Insufficient documentation

## 2021-10-22 DIAGNOSIS — Z1239 Encounter for other screening for malignant neoplasm of breast: Secondary | ICD-10-CM

## 2022-09-19 ENCOUNTER — Other Ambulatory Visit: Payer: Self-pay | Admitting: Advanced Practice Midwife

## 2022-09-19 DIAGNOSIS — Z308 Encounter for other contraceptive management: Secondary | ICD-10-CM

## 2022-09-28 ENCOUNTER — Other Ambulatory Visit: Payer: Self-pay | Admitting: Advanced Practice Midwife

## 2022-09-28 ENCOUNTER — Other Ambulatory Visit: Payer: Self-pay | Admitting: Family Medicine

## 2022-09-28 DIAGNOSIS — Z1231 Encounter for screening mammogram for malignant neoplasm of breast: Secondary | ICD-10-CM

## 2022-10-28 ENCOUNTER — Ambulatory Visit
Admission: RE | Admit: 2022-10-28 | Discharge: 2022-10-28 | Disposition: A | Discharge status: Home / Self Care | Payer: BC Managed Care – PPO | Source: Ambulatory Visit | Attending: Family Medicine | Admitting: Family Medicine

## 2022-10-28 DIAGNOSIS — Z1231 Encounter for screening mammogram for malignant neoplasm of breast: Secondary | ICD-10-CM | POA: Diagnosis present

## 2022-11-02 ENCOUNTER — Other Ambulatory Visit: Payer: Self-pay | Admitting: Family Medicine

## 2022-11-02 DIAGNOSIS — R921 Mammographic calcification found on diagnostic imaging of breast: Secondary | ICD-10-CM

## 2022-11-02 DIAGNOSIS — R928 Other abnormal and inconclusive findings on diagnostic imaging of breast: Secondary | ICD-10-CM

## 2022-11-09 ENCOUNTER — Ambulatory Visit
Admission: RE | Admit: 2022-11-09 | Discharge: 2022-11-09 | Disposition: A | Discharge status: Home / Self Care | Payer: BC Managed Care – PPO | Source: Ambulatory Visit | Attending: Family Medicine | Admitting: Family Medicine

## 2022-11-09 DIAGNOSIS — R921 Mammographic calcification found on diagnostic imaging of breast: Secondary | ICD-10-CM | POA: Insufficient documentation

## 2022-11-09 DIAGNOSIS — R928 Other abnormal and inconclusive findings on diagnostic imaging of breast: Secondary | ICD-10-CM | POA: Diagnosis present

## 2022-12-12 ENCOUNTER — Other Ambulatory Visit: Payer: Self-pay | Admitting: Advanced Practice Midwife

## 2022-12-12 DIAGNOSIS — Z308 Encounter for other contraceptive management: Secondary | ICD-10-CM

## 2023-01-10 ENCOUNTER — Other Ambulatory Visit: Payer: Self-pay | Admitting: Advanced Practice Midwife

## 2023-01-10 DIAGNOSIS — Z308 Encounter for other contraceptive management: Secondary | ICD-10-CM

## 2023-07-21 ENCOUNTER — Ambulatory Visit: Payer: BC Managed Care – PPO

## 2023-07-21 DIAGNOSIS — K640 First degree hemorrhoids: Secondary | ICD-10-CM

## 2023-07-21 DIAGNOSIS — Z1211 Encounter for screening for malignant neoplasm of colon: Secondary | ICD-10-CM

## 2023-08-11 ENCOUNTER — Ambulatory Visit: Payer: BC Managed Care – PPO

## 2023-08-11 VITALS — BP 120/80 | Ht 62.0 in | Wt 164.0 lb

## 2023-08-11 DIAGNOSIS — N926 Irregular menstruation, unspecified: Secondary | ICD-10-CM | POA: Diagnosis not present

## 2023-08-11 DIAGNOSIS — B009 Herpesviral infection, unspecified: Secondary | ICD-10-CM

## 2023-08-11 MED ORDER — NORELGESTROMIN-ETH ESTRADIOL 150-35 MCG/24HR TD PTWK
1.0000 | MEDICATED_PATCH | TRANSDERMAL | 13 refills | Status: AC
Start: 2023-08-11 — End: ?

## 2023-08-11 MED ORDER — VALACYCLOVIR HCL 500 MG PO TABS
500.0000 mg | ORAL_TABLET | Freq: Every day | ORAL | 11 refills | Status: AC
Start: 2023-08-11 — End: ?

## 2023-08-11 NOTE — Progress Notes (Signed)
   GYN ENCOUNTER  Encounter for irregular uterine bleeding  Subjective  HPI: Dawn Wall is a 47 y.o. [id number] who presents today for irregular uterine bleeding.   Irregular periods in last two years. Started with spotting around ovulation and then progressed to more bleeding to the point where she was having two full periods a month. Week of July 13 started bleeding again and continued to bleed and spot for about 3 weeks. She had been on COCs for years and discontinued them approximately one year ago.  Perimenopause symptoms include hair loss, low libido, hot flashes, mid-section weight gain, fatigue, brain fog, irritability.   She has been to see several specialists for various symptoms including neurology and endocrinology where lab work has been completed ruling out thyroid and other possible concerns.  Past Medical History:  Diagnosis Date   Family history of breast cancer    Past Surgical History:  Procedure Laterality Date   LITHOTRIPSY     OB History     Gravida  2   Para  2   Term  2   Preterm      AB      Living  2      SAB      IAB      Ectopic      Multiple      Live Births  2          Allergies  Allergen Reactions   Iodine Anaphylaxis, Hives and Rash   Shellfish Allergy Anaphylaxis, Hives and Rash    Review of Systems  12 point ROS negative except for pertinent positives noted in HPO above.   Objective  BP 120/80   Ht 5\' 2"  (1.575 m)   Wt 164 lb (74.4 kg)   LMP 07/14/2023   BMI 30.00 kg/m   Physical examination GENERAL APPEARANCE: alert, well appearing LUNGS: normal work of breathing HEART: normal heart rate PELVIC EXAM: declined  Assessment/Plan - Reviewed that her symptoms and menstrual cycle patterns fit the clinical picture for hormonal changes related to perimenopause. - Discussed that hormonal testing at this time would likely not provide any additional benefit to diagnosis. Reviewed management options including use of  hormonal contraception. Discussed estrogen/progesterone options vs. LNG-IUD. She would like to trial the Xulane patch at this time. Reviewed continuous cycling with Xulane patch. Offered to repeat thyroid hormones and order transvaginal ultrasound to rule out structural etiologies which she declines at this time as she would like to see how hormonal control helps.  - Plan to follow up in approximately 3 months if she continues to have concerns after trial of Xulane patch.   [name], CNM  08/11/23 4:18 PM

## 2023-10-19 ENCOUNTER — Other Ambulatory Visit (HOSPITAL_BASED_OUTPATIENT_CLINIC_OR_DEPARTMENT_OTHER): Payer: Self-pay

## 2023-10-19 MED ORDER — WEGOVY 0.25 MG/0.5ML ~~LOC~~ SOAJ
0.2500 mg | SUBCUTANEOUS | 0 refills | Status: AC
  Filled 2023-10-19: qty 2, 28d supply, fill #0

## 2024-05-14 ENCOUNTER — Telehealth: Payer: Self-pay

## 2024-05-14 NOTE — Telephone Encounter (Signed)
 Patient states having abnormal bleeding starting on Gradual and Date of onset: approximately 4 weeks ago for 4weeks. Patient states the bleeding is moderate, heavy.  Patient states moderate severe abdominal pain and cramping Patient states has not had started new birth control within the last 3 months.    Patient  has not had any recent surgeries.      PLAN: Discussed plan If soaking a pad every 30 minutes to an hour or soiling clothes; appt if provider is in office or go to ED **notify provider  Patient declined ED, appointment in office tomorrow.

## 2024-05-15 ENCOUNTER — Ambulatory Visit

## 2024-05-15 VITALS — BP 122/71 | HR 97 | Resp 16 | Ht 62.0 in | Wt 159.5 lb

## 2024-05-15 DIAGNOSIS — N926 Irregular menstruation, unspecified: Secondary | ICD-10-CM

## 2024-05-15 DIAGNOSIS — N939 Abnormal uterine and vaginal bleeding, unspecified: Secondary | ICD-10-CM | POA: Diagnosis not present

## 2024-05-15 DIAGNOSIS — Z1231 Encounter for screening mammogram for malignant neoplasm of breast: Secondary | ICD-10-CM

## 2024-05-15 MED ORDER — NORETHIN ACE-ETH ESTRAD-FE 1-20 MG-MCG PO TABS: 1.0000 | ORAL_TABLET | Freq: Every day | ORAL | 11 refills | Status: AC

## 2024-05-15 NOTE — Progress Notes (Signed)
   GYN ENCOUNTER  Encounter for vaginal bleeding  Subjective  HPI: Dawn Wall is a 48 y.o. G2P2002 who presents today for follow up after starting Xulane patch in August of last year.   Has been using the Xulane patch with continuous cycling. Was doing well with less bleeding until two months ago, then started spotting. In last 4-5 weeks, has had increased bleeding to the point that she is bleeding through her pants.   Took patch off last Wednesday and then had more bleeding than she has ever had, particularly on Saturday. She continued to have bleeding that was soaking through multiple pads and several layers of clothing in less than 1-2 hours. Replaced patch on Tuesday and started ibuprofen. Bleeding has now decreased.   She states she has been having difficulty with patch not staying in place and causing irritation with itching. She is concerned she is not getting the full effect of the hormones because of this. Additionally, the breakthrough bleeding is affecting her sex life.   She is interested in discussing more permanent options such as ablation or hysterectomy.  Past Medical History:  Diagnosis Date   Family history of breast cancer    Past Surgical History:  Procedure Laterality Date   LITHOTRIPSY     OB History     Gravida  2   Para  2   Term  2   Preterm      AB      Living  2      SAB      IAB      Ectopic      Multiple      Live Births  2          Allergies  Allergen Reactions   Iodine Anaphylaxis, Hives and Rash   Shellfish Allergy Anaphylaxis, Hives and Rash    Review of Systems  12 point ROS negative except for pertinent positives noted in HPO above.   Objective  BP 122/71   Pulse 97   Resp 16   Ht 5\' 2"  (1.575 m)   Wt 159 lb 8 oz (72.3 kg)   BMI 29.17 kg/m   Physical examination GENERAL APPEARANCE: alert, well appearing LUNGS: normal work of breathing HEART: normal heart rate  Assessment/Plan - Discussed issues with  breakthrough bleeding that can occur when using hormonal birth control with continuous cycling. Reassuring that bleeding has decreased with replacement of the patch after a week off. She has previously used COCs and had no issues with them. She was concerned she had been on COCs for too long which is why she wanted to try a different method. We discussed the safety profile of COCs and after this discussion, she would like to try pills again. Rx provided. We discussed giving her body 2-3 cycles to regulate after starting pills.  - TVUS ordered to rule out uterine pathologies.  - We reviewed ablation and hysterectomy as more permanent options and need to schedule appointment with MD if she would like to further pursue either of these options.  - Mammogram ordered per patient request as she is due.   Verita Glassman Adleigh Mcmasters, CNM  05/15/24 4:27 PM

## 2024-05-16 ENCOUNTER — Ambulatory Visit: Admitting: Certified Nurse Midwife

## 2024-06-10 ENCOUNTER — Ambulatory Visit

## 2024-06-10 DIAGNOSIS — N939 Abnormal uterine and vaginal bleeding, unspecified: Secondary | ICD-10-CM | POA: Diagnosis not present

## 2024-06-10 DIAGNOSIS — N926 Irregular menstruation, unspecified: Secondary | ICD-10-CM
